# Patient Record
Sex: Female | Born: 1990 | Race: White | Hispanic: No | Marital: Single | Smoking: Former smoker
Health system: Southern US, Community
[De-identification: ages and names within clinical notes are randomized; demographics above are authoritative.]

## PROBLEM LIST (undated history)

## (undated) DIAGNOSIS — F1911 Other psychoactive substance abuse, in remission: Secondary | ICD-10-CM

## (undated) DIAGNOSIS — F329 Major depressive disorder, single episode, unspecified: Secondary | ICD-10-CM

## (undated) DIAGNOSIS — E010 Iodine-deficiency related diffuse (endemic) goiter: Secondary | ICD-10-CM

## (undated) DIAGNOSIS — Z8742 Personal history of other diseases of the female genital tract: Secondary | ICD-10-CM

## (undated) DIAGNOSIS — F32A Depression, unspecified: Secondary | ICD-10-CM

## (undated) DIAGNOSIS — T3390XA Superficial frostbite of unspecified sites, initial encounter: Secondary | ICD-10-CM

## (undated) DIAGNOSIS — L709 Acne, unspecified: Secondary | ICD-10-CM

## (undated) DIAGNOSIS — F909 Attention-deficit hyperactivity disorder, unspecified type: Secondary | ICD-10-CM

## (undated) HISTORY — DX: Depression, unspecified: F32.A

## (undated) HISTORY — DX: Major depressive disorder, single episode, unspecified: F32.9

## (undated) HISTORY — DX: Iodine-deficiency related diffuse (endemic) goiter: E01.0

## (undated) HISTORY — DX: Acne, unspecified: L70.9

## (undated) HISTORY — DX: Other psychoactive substance abuse, in remission: F19.11

## (undated) HISTORY — DX: Superficial frostbite of unspecified sites, initial encounter: T33.90XA

## (undated) HISTORY — DX: Attention-deficit hyperactivity disorder, unspecified type: F90.9

## (undated) HISTORY — DX: Personal history of other diseases of the female genital tract: Z87.42

---
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
## 2009-04-06 ENCOUNTER — Ambulatory Visit: Payer: Self-pay | Admitting: Internal Medicine

## 2009-08-08 ENCOUNTER — Ambulatory Visit: Payer: Self-pay | Admitting: Internal Medicine

## 2009-08-08 DIAGNOSIS — R5381 Other malaise: Secondary | ICD-10-CM

## 2009-08-08 DIAGNOSIS — R5383 Other fatigue: Secondary | ICD-10-CM

## 2009-08-08 DIAGNOSIS — F17200 Nicotine dependence, unspecified, uncomplicated: Secondary | ICD-10-CM

## 2009-08-08 LAB — CONVERTED CEMR LAB
Beta hcg, urine, semiquantitative: NEGATIVE
Bilirubin Urine: NEGATIVE
Nitrite: NEGATIVE
Protein, U semiquant: NEGATIVE
Urobilinogen, UA: 1

## 2009-08-17 LAB — CONVERTED CEMR LAB
ALT: 14 units/L (ref 0–35)
AST: 24 units/L (ref 0–37)
Albumin: 4.9 g/dL (ref 3.5–5.2)
Alkaline Phosphatase: 48 units/L (ref 39–117)
Basophils Absolute: 0.1 10*3/uL (ref 0.0–0.1)
CO2: 28 meq/L (ref 19–32)
Chloride: 107 meq/L (ref 96–112)
Eosinophils Absolute: 0.2 10*3/uL (ref 0.0–0.7)
Free T4: 0.9 ng/dL (ref 0.6–1.6)
GFR calc non Af Amer: 85.08 mL/min (ref >60)
Glucose, Bld: 79 mg/dL (ref 70–99)
HCT: 41.1 % (ref 36.0–46.0)
Hemoglobin: 13.6 g/dL (ref 12.0–15.0)
Hgb A1c MFr Bld: 5.1 % (ref 4.6–6.5)
Lymphocytes Relative: 31.8 % (ref 12.0–46.0)
Lymphs Abs: 2 10*3/uL (ref 0.7–4.0)
MCHC: 33.2 g/dL (ref 30.0–36.0)
Monocytes Absolute: 0.2 10*3/uL (ref 0.1–1.0)
Monocytes Relative: 3.8 % (ref 3.0–12.0)
Neutro Abs: 3.8 10*3/uL (ref 1.4–7.7)
Neutrophils Relative %: 59.9 % (ref 43.0–77.0)
Potassium: 4.4 meq/L (ref 3.5–5.1)
T3, Free: 2.8 pg/mL (ref 2.3–4.2)
TSH: 1.31 microintl units/mL (ref 0.35–5.50)
Total Protein: 8.1 g/dL (ref 6.0–8.3)
WBC: 6.3 10*3/uL (ref 4.5–10.5)

## 2009-08-22 ENCOUNTER — Ambulatory Visit: Payer: Self-pay | Admitting: Internal Medicine

## 2009-08-23 ENCOUNTER — Ambulatory Visit: Payer: Self-pay | Admitting: Internal Medicine

## 2009-08-27 ENCOUNTER — Encounter: Payer: Self-pay | Admitting: Internal Medicine

## 2009-08-27 LAB — CONVERTED CEMR LAB
Glucose, 2 hour: 111 mg/dL
Glucose, Fasting: 98 mg/dL (ref 70–99)

## 2009-09-28 ENCOUNTER — Ambulatory Visit: Payer: Self-pay | Admitting: Cardiology

## 2009-09-28 DIAGNOSIS — R55 Syncope and collapse: Secondary | ICD-10-CM

## 2009-12-07 ENCOUNTER — Ambulatory Visit: Payer: Self-pay | Admitting: Internal Medicine

## 2009-12-07 DIAGNOSIS — F4323 Adjustment disorder with mixed anxiety and depressed mood: Secondary | ICD-10-CM

## 2010-02-22 ENCOUNTER — Emergency Department (HOSPITAL_COMMUNITY): Admission: EM | Admit: 2010-02-22 | Discharge: 2010-02-23 | Payer: Self-pay | Admitting: Emergency Medicine

## 2010-03-26 ENCOUNTER — Telehealth: Payer: Self-pay | Admitting: *Deleted

## 2010-04-30 ENCOUNTER — Telehealth: Payer: Self-pay | Admitting: Internal Medicine

## 2010-06-20 ENCOUNTER — Emergency Department (HOSPITAL_COMMUNITY): Admission: EM | Admit: 2010-06-20 | Discharge: 2010-02-24 | Payer: Self-pay | Admitting: Emergency Medicine

## 2010-08-13 NOTE — Progress Notes (Signed)
Summary: Pt req work in ov with Dr. Fabian Sharp today for ear inf.  Phone Note Call from Patient Call back at [phone] cell   Caller: Patient Summary of Call: Pt called and is req work in appt with Dr. Fabian Sharp today. Pt has possible ear inf. Pls advise.      Initial call taken by: Lucy Antigua,  April 30, 2010 11:38 AM  Follow-up for Phone Call        Pt called back to advise that she will go to [facility] for eval of possible ear infection..... Pt was offered appt with another physician but declined stating she is already on her way to UC.  Follow-up by: Debbra Riding,  April 30, 2010 12:09 PM

## 2010-08-13 NOTE — Progress Notes (Signed)
Summary: Pt req script for Vyvanse and Focalin  Phone Note Call from Patient Call back at [phone] cell   Caller: Patient Summary of Call: Pt called and said that Dr. Fabian Sharp told her that she would start prescribing her meds that pt normally gets from psychiatrist. Pt req script for Vyvanse ? mg  and Focalin 15mg .  Pls call when ready for pick.    Initial call taken by: Lucy Antigua,  March 26, 2010 1:49 PM  Follow-up for Phone Call        No notes from Psych. According to last ov notes she was going to set up with another psych about her medication management. Has she seen anyone else? If so we need a note of treatment plan. Dr. Rosezella Florida opinion she should treated by a psych. until such time the specialist feels it is app. for primary care to manage her medication. We will need a note stating this as well. Follow-up by: Romualdo Bolk, CMA Duncan Dull),  March 26, 2010 2:19 PM  Additional Follow-up for Phone Call Additional follow up Details #1::        Spoke to pt and she was given a vyvanse 20mg  of her brother's by her mother. Pt hasn't seen any other md for this. She liked it better but hasn't been on any medication except the prozac since school has been out. Pt is aware that she does need to see a specialist for medication management until the specialist feels that it would be okay for a primary care to manage her care. Pt would like to know who Dr. Fabian Sharp would recommend. Additional Follow-up by: Romualdo Bolk, CMA Duncan Dull),  March 26, 2010 3:50 PM    Additional Follow-up for Phone Call Additional follow up Details #2::    Per Dr. Fabian Sharp- try Dr. Maren Beach or Dr. Loralie Champagne office. Romualdo Bolk, CMA (AAMA)  March 26, 2010 5:13 PM Left message on machine about this. Follow-up by: Romualdo Bolk, CMA (AAMA),  March 27, 2010 9:14 AM

## 2010-08-13 NOTE — Assessment & Plan Note (Signed)
Summary: summer camp cpx//ccm   Vital Signs:  Patient profile:   20 year old female Menstrual status:  Depo Provera LMP:     11/29/2009 Height:      65.6 inches Weight:      145 pounds Temp:     98.2 degrees F oral Pulse rate:   82 / minute BP sitting:   110 / 90  (right arm) Cuff size:   regular  Vitals Entered By: Kathrynn Speed CMA (Dec 07, 2009 9:20 AM) CC: Jacqueline Griffith  Vision Screening:Left eye w/o correction: 20 / 30 Right Eye w/o correction: 20 / 30 Both eyes w/o correction:  20/ 25        Vision Entered By: Kathrynn Speed CMA (Dec 07, 2009 9:42 AM) LMP (date): 11/29/2009 LMP - Character: normal Menarche (age onset years): 13   Menses interval (days): irrg. Menstrual flow (days): 3 Enter LMP: 11/29/2009   Preventive Screening-Counseling & Management  Alcohol-Tobacco     Alcohol drinks/day: 0     Smoking Status: quit     Year Quit: 06/13/2009  Caffeine-Diet-Exercise     Caffeine use/day: 1-2     Does Patient Exercise: yes  History of Present Illness: Jacqueline Griffith  comes in today   for a check up for [place] program that includes  wilderness activity and rock climbing in [place].  She needs check up  and form filled out . Since last visit changed   ocps to Nuvaring  and  feels less moody.     Ocass dizziness and not as frequent   and does better when eats   something.  Saw Dr Shirlee Latch and was supposed to get echo and holter 24 hour but never hear from office about this while she was in school .  Will not be taking focalin at camp. usually sees Dr Madaline Guthrie. but asks to have [place] rx prozac .     Wants to change psych. still sees counseler.    Past History:  Past Medical History: 1. acne 2.ADHD 3. depression 4. ? thyromegly    5. frostbite  toes  Hx of substance abuse   Consults Dr. Samule Dry prozac rx  Past History:  Care Management: Gynecology: Vincente Poli Psychiatry  : Clista Bernhardt  and counselor .   Psychology.Bradley Ferris   Social History: Does  Patient Exercise:  yes  Review of Systems  The patient denies anorexia, fever, weight loss, weight gain, vision loss, decreased hearing, hoarseness, syncope, prolonged cough, abdominal pain, melena, hematochezia, severe indigestion/heartburn, unusual weight change, abnormal bleeding, enlarged lymph nodes, angioedema, and breast masses.         some upper back pain she attributes to large breasts  and considering breast reduction. no le difficulties   Physical Exam  General:      Well appearing adolescent,no acute distress Head:      normocephalic and atraumatic  Eyes:      PERRL, EOMs full, conjunctiva clear  Ears:      TM's pearly gray with normal light reflex and landmarks, canals clear  Nose:      Clear without Rhinorrhea Mouth:      Clear without erythema, edema or exudate, mucous membranes moist teeth adequat repair Neck:      supple without adenopathy  Chest wall:      no deformities or breast masses noted.  Tanner V Breast.   Lungs:      Clear to ausc, no crackles, rhonchi or wheezing, no grunting, flaring or retractions  Heart:  RRR without murmur quiet precordium.   Abdomen:      BS+, soft, non-tender, no masses, no hepatosplenomegaly  Genitalia:      normal female  Musculoskeletal:      no scoliosis, normal gait, normal posture Pulses:      femoral pulses present  without dealy Extremities:      Well perfused with no cyanosis or deformity noted  Neurologic:      Neurologic exam grossly intact  non focal no tremor Developmental:      alert and cooperative  Skin:      intact without lesions, rashes  Cervical nodes:      No lymphadenopathy noted Axillary nodes:      no significant adenopathy.   Inguinal nodes:      no significant adenopathy.   Psychiatric:      alert and cooperative   Impression & Recommendations:  Problem # 1:  PREVENTIVE HEALTH CARE (ICD-V70.0)  Discussed nutrition,exercise,diet,healthy weight, vitamin D and calcium.    states  had all 3 HPV and is UTD      Orders: Vision Screening (16109)  Problem # 2:  ADJUSTMENT DISORDER WITH MIXED FEATURES (ICD-309.28) depression and hx of subs abuse  doing well now. ok t refill meds until  gets another psych .  Problem # 3:  DIZZINESS SPELLS (ICD-780.4) Assessment: Improved still rec  getting echo and holter before doing the wilderness  camp at end of July   Problem # 4:  hx of frostbite   Problem # 5:  upper back pain ms no alarm features  could be postural  vs  relative gynecomastia.  Complete Medication List: 1)  Prozac 20 Mg Caps (Fluoxetine hcl) .... Take 1 capsule by mouth once a day 2)  Focalin Xr 20 Mg Xr24h-cap (Dexmethylphenidate hcl) .... Take one tablet as needed 3)  Nuvaring 0.12-0.015 Mg/24hr Ring (Etonogestrel-ethinyl estradiol)  Immunization History:  Tetanus/Td Immunization History:    Tetanus/Td:  historical (11/20/1994)  Hepatitis A Immunization History:    Hepatitis A # 1:  historical (09/22/2006)  Patient Instructions: 1)  Suggest get an adult psych and continue counseling . 2)  Finish the cardiology evlauation with echo and 24 hour holter. 3)  we will contact there office and you should also call to set this up. 4)  I suspect it will be normal but would do this before going to the [employer] camp. 5)  Agree with consultation  about  breast reduction  if  causing upper back pain.  Prescriptions: PROZAC 20 MG CAPS (FLUOXETINE HCL) Take 1 capsule by mouth once a day  #30 x 2   Entered and Authorized by:   Madelin Headings MD   Signed by:   Madelin Headings MD on 12/07/2009   Method used:   Electronically to        [facility])       [address]       [place], [place]  [address]       Ph: [phone]       Fax: [phone]   RxID:   [id number]  ][Preventive Care Screening2-CCC] appt  made for echo    in June  . pt will be contacted about holter   form signed .   [facility].  Prevention & Chronic  Care Immunizations   Influenza vaccine: Fluvax 3+  (04/06/2009)    Tetanus booster: 11/20/1994: Historical    Pneumococcal vaccine:  Not documented  Other Screening   Pap smear: Not documented   Smoking status: quit  (12/07/2009)

## 2010-08-13 NOTE — Assessment & Plan Note (Signed)
Summary: ? virus//ccm   Vital Signs:  Patient profile:   20 year old female Menstrual status:  Depo Provera Weight:      143 pounds Pulse rate:   72 / minute BP sitting:   100 / 70  (right arm) Cuff size:   regular  Vitals Entered By: Romualdo Bolk, CMA (AAMA) (August 22, 2009 2:59 PM) CC: Weakness has gotten worse and legs are now cramping up. Legs started cramping up 1 week ago.   History of Present Illness: Jacqueline Griffith comes in today for   above  follow up of fatigue   cold sweats and spells.   Stopped the nicotine replacement.   but  cold sweats still continue .    Dizzy spell described as   feeling  weak and faint  and   going on 3 x per day  even when driving. ( had to pull over side of road)   feels hot      episodic dizziness not related to given activity and then leg cramps at night  . hard  to sleep with this.  No change in focalin 3 x per week.  Has been on this for a while  Says this is different than a panic attack . no known palpitations.    of cp sob with this.   spells are weak and near syncopal and shaky and sometimes gets a  soda with this.   Wonders about "Hypoglycemia" no hx of diabetes  or fasting   Preventive Screening-Counseling & Management  Alcohol-Tobacco     Alcohol drinks/day: 0     Smoking Status: quit     Year Quit: 06/13/2009  Caffeine-Diet-Exercise     Caffeine use/day: 1-2     Does Patient Exercise: no  Current Medications (verified): 1)  Prozac 20 Mg Caps (Fluoxetine Hcl) .... Take 1 Capsule By Mouth Once A Day 2)  Depo-Provera 150 Mg/ml Susp (Medroxyprogesterone Acetate) 3)  Focalin Xr 10 Mg Xr24h-Cap (Dexmethylphenidate Hcl)  ? Dose .... Take Daily As Needed  Per Psych  Allergies (verified): No Known Drug Allergies  Past History:  Past medical, surgical, family and social histories (including risk factors) reviewed, and no changes noted (except as noted below).  Past Medical History: Reviewed history from 08/08/2009 and  no changes required. acne anxiety depression ? thyromegly    Hx of rehab from RD use last year doing well.       Consults Dr. Samule Dry prozac rx  Family History: Reviewed history from 08/21/2008 and no changes required. neg for thyroid disease    Social History: Reviewed history from 08/08/2009 and no changes required. caffiene soda ocassional   ? school ok.   Non smoker   quit of 4-5   in december  nicotinepatch   use. school at [employer]   lives apt with BF  Sleep :   8-9 hours   could sleep more.    Review of Systems  The patient denies anorexia, fever, weight loss, weight gain, vision loss, decreased hearing, hoarseness, chest pain, dyspnea on exertion, peripheral edema, prolonged cough, abdominal pain, transient blindness, difficulty walking, depression, unusual weight change, abnormal bleeding, enlarged lymph nodes, and angioedema.         lmp  on depo   neg ucg last time.  Physical Exam  General:  Well-developed,well-nourished,in no acute distress; alert,appropriate and cooperative throughout examination Head:  normocephalic and atraumatic.   Eyes:  vision grossly intact and  pupils equal.   Ears:  R ear normal and L ear normal.   Nose:  no external deformity and no external erythema.   Mouth:  pharynx pink and moist.   Neck:  No deformities, masses, or tenderness noted. Lungs:  Normal respiratory effort, chest expands symmetrically. Lungs are clear to auscultation, no crackles or wheezes. Heart:  Normal rate and regular rhythm. S1 and S2 normal without gallop, murmur, click, rub or other extra sounds. Abdomen:  Bowel sounds positive,abdomen soft and non-tender without masses, organomegaly or   noted. Pulses:  nl cap refill  Neurologic:  grossly nl  Skin:  turgor normal, color normal, no ecchymoses, and no petechiae.   Cervical Nodes:  No lymphadenopathy noted Psych:  Oriented X3, good eye contact, not anxious appearing, and not  depressed appearing.     Impression & Recommendations:  Problem # 1:  DIZZINESS SPELLS (ICD-780.4) episodic   persisting and progressive  . would have though post viral would improve.  unclear cause   recently more problematic    ocurred when driving  dont really sound cardiac     reactive hypoglycemia possible but not  is not a disease just a condition.   EKG   reading as abnormal conduction but intervals ok some  sinus arrythmia    pr  still ok  q in V2 .    sheis on 2 meds that can effect  qt   intervals  prozac and focalin xr but no changes   recently.    Disc hypoglycemia as a situation and not a reaction and not a disease.  but will go ahead and do a GTT.   Orders: EKG w/ Interpretation (93000) TLB-Glucose Tol (3 HR) (16109-6EAVW) Cardiology Referral (Cardiology)  Problem # 2:  ? of ABNORMAL ELECTROCARDIOGRAM (ICD-794.31)  see tracing   get cards to check. Orders: Cardiology Referral (Cardiology)  Complete Medication List: 1)  Prozac 20 Mg Caps (Fluoxetine hcl) .... Take 1 capsule by mouth once a day 2)  Depo-provera 150 Mg/ml Susp (Medroxyprogesterone acetate) 3)  Focalin Xr 10 Mg Xr24h-cap (dexmethylphenidate Hcl) ? Dose  .... Take daily as needed  per psych  Patient Instructions: 1)  will do cardiology consult  to get opinion  on your EKG and symptom  2)  We should schedule a  glucose tolerance test  at the  [facility] lab.

## 2010-08-13 NOTE — Assessment & Plan Note (Signed)
Summary: fatigue/cold sweats/njr   Vital Signs:  Patient profile:   20 year old female Menstrual status:  Depo Provera Height:      65 inches Weight:      139 pounds BMI:     23.21 Temp:     98.5 degrees F oral Pulse rate:   66 / minute BP sitting:   110 / 60  (right arm) Cuff size:   regular  Vitals Entered By: Romualdo Bolk, CMA (AAMA) (August 08, 2009 3:26 PM) CC: For the past few weeks, pt has been having cold sweats and fatigue. Pt states that she gets faint and weak. Pt states that her body temp gets really hot. She has been having more stomach pains and ha's. When she was younger she was dx with lactose intolerance and leaky bladder. She has also been having diarrhea and then constipation. Pt would like to get labs done. LMP - Character: normal Menarche (age onset years): 13   Menses interval (days): irrg. Menstrual flow (days): 3 Menstrual Status Depo Provera   History of Present Illness: Jacqueline Griffith comes  in today for   SDA visit  for above symptom .     HAs and SA  seem  fairly chronic and tolerable   but now has episodes of    cold sweats.   Hard to exercise  because of fatigue .     Feels shaky.   feels faint. at times but no syncope.  worried about diabetes or other dx.  Weight up more than down.   insecure  about weight but no meds to lose weight.  Not skipping meals.   On  depo for 10 months.   No sig caffiene and no alcohol or rd.   hx of rehab a year ago to be drug free  doing well. now back in school .   No known risk of pregnancy.   Mood : sees psych and counseot and is on  prozac and doing well. Takes focalin as needed for school.   Is trying to stop tobacco again  about 4-5 per day using 21 nicotine patch.  off and on .     Preventive Screening-Counseling & Management  Alcohol-Tobacco     Alcohol drinks/day: 0     Smoking Status: quit     Year Quit: 06/13/2009  Caffeine-Diet-Exercise     Caffeine use/day: 1-2     Does Patient  Exercise: no  Current Medications (verified): 1)  Prozac 20 Mg Caps (Fluoxetine Hcl) .... Take 1 Capsule By Mouth Once A Day 2)  Depo-Provera 150 Mg/ml Susp (Medroxyprogesterone Acetate)  Allergies (verified): No Known Drug Allergies  Past History:  Past medical, surgical, family and social histories (including risk factors) reviewed, and no changes noted (except as noted below).  Past Medical History: acne anxiety depression ? thyromegly    Hx of rehab from RD use last year doing well.       Consults Dr. Samule Dry prozac rx  Past History:  Care Management: Gynecology: Vincente Poli Psychologist: Clista Bernhardt  and counselor   Family History: Reviewed history from 08/21/2008 and no changes required. neg for thyroid disease    Social History: Reviewed history from 08/21/2008 and no changes required. caffiene soda ocassional   ? school ok.   Non smoker   quit of 4-5   in december  nicotinepatch   use. school at [employer]   lives apt with BF  Sleep :  8-9 hours   could sleep more.  Smoking Status:  quit Caffeine use/day:  1-2 Does Patient Exercise:  no  Review of Systems  The patient denies anorexia, fever, weight loss, weight gain, vision loss, decreased hearing, hoarseness, chest pain, syncope, dyspnea on exertion, peripheral edema, prolonged cough, headaches, hemoptysis, abdominal pain, melena, hematochezia, severe indigestion/heartburn, hematuria, incontinence, transient blindness, difficulty walking, unusual weight change, abnormal bleeding, enlarged lymph nodes, and angioedema.         this fatigue is not felt from dpression  Physical Exam  General:  Well-developed,well-nourished,in no acute distress; alert,appropriate and cooperative throughout examination Head:  normocephalic and atraumatic.   Eyes:  vision grossly intact.   Ears:  R ear normal and L ear normal.   Nose:  no external deformity, no external erythema, and no nasal  discharge.   Mouth:  pharynx pink and moist.   Neck:  No deformities, masses, or tenderness noted. ?thyroid palpable. Lungs:  Normal respiratory effort, chest expands symmetrically. Lungs are clear to auscultation, no crackles or wheezes.no dullness.   Heart:  Normal rate and regular rhythm. S1 and S2 normal without gallop, murmur, click, rub or other extra sounds. Abdomen:  Bowel sounds positive,abdomen soft and non-tender without masses, organomegaly or   noted. Pulses:  nl cap refill  Neurologic:  non focal  Skin:  turgor normal, color normal, no ecchymoses, no petechiae, and no purpura.   Cervical Nodes:  No lymphadenopathy noted Psych:  Oriented X3, normally interactive, good eye contact, not anxious appearing, and not depressed appearing.     Impression & Recommendations:  Problem # 1:  MALAISE AND FATIGUE (ICD-780.79) Assessment New non focal exam r/o metabolic  cause. Orders: TLB-BMP (Basic Metabolic Panel-BMET) (80048-METABOL) TLB-CBC Platelet - w/Differential (85025-CBCD) TLB-Hepatic/Liver Function Pnl (80076-HEPATIC) TLB-TSH (Thyroid Stimulating Hormone) (84443-TSH) TLB-T3, Free (Triiodothyronine) (84481-T3FREE) TLB-T4 (Thyrox), Free (84439-FT4R) TLB-A1C / Hgb A1C (Glycohemoglobin) (83036-A1C) TLB-Sedimentation Rate (ESR) (85652-ESR) T- * Misc. Laboratory test [phone]) UA Dipstick w/o Micro (automated)  (81003) Urine Pregnancy Test  (60454)  Problem # 2:  cold sweats  consider nicotine therapy possible adding  r/o metabolic  doesnt sound like fever  reassuring that exam is unrevealing.   Complete Medication List: 1)  Prozac 20 Mg Caps (Fluoxetine hcl) .... Take 1 capsule by mouth once a day 2)  Depo-provera 150 Mg/ml Susp (Medroxyprogesterone acetate) 3)  Focalin Xr 10 Mg Xr24h-cap (dexmethylphenidate Hcl) ? Dose  .... Take daily as needed  per psych  Patient Instructions: 1)  Decrease   nicotine patch  amount.   and wean if possible  2)  To see if  cold sweats are  related.    3)  You will be informed of lab results when available.  then plan follow up  Laboratory Results   Urine Tests    Routine Urinalysis   Color: yellow Appearance: Clear Glucose: negative   (Normal Range: Negative) Bilirubin: negative   (Normal Range: Negative) Ketone: negative   (Normal Range: Negative) Spec. Gravity: 1.020   (Normal Range: 1.003-1.035) Blood: negative   (Normal Range: Negative) pH: 7.0   (Normal Range: 5.0-8.0) Protein: negative   (Normal Range: Negative) Urobilinogen: 1.0   (Normal Range: 0-1) Nitrite: negative   (Normal Range: Negative) Leukocyte Esterace: negative   (Normal Range: Negative)    Urine HCG: negative Comments: Rita Ohara  August 08, 2009 4:42 PM

## 2010-08-13 NOTE — Assessment & Plan Note (Signed)
Summary: np6/resch from NOS/jss   Referring Provider:  Berniece Andreas, MD Primary Provider:  Madelin Headings MD  CC:  new patient.  hx abnormal EKG.  History of Present Illness: 20 yo with history of ADHD on dexmethylphenidate presents for evaluation of "dizzy spells."  Since 12/10, she has had 2-3 episodes per week.  She will feel lightheaded, lasting for anywhere from 5-30 minutes.  She will feel hot, flushed, and diaphoretic with the episodes.  She has never passed out.  No palpitations or racing heart rate.  Episodes do not appear to be related to position, sometimes she will be sitting and sometimes standing.  She has never fallen.  She has checked her blood glucose during a spell, and it has not been low.  No trigger for these spells can be identified. 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  She has been staying up until late at night.  She has been taking more dexmethylphenidate this semester.   ECG: NSR, sinus arrhythmia, normal QTc. Normal overall ECG.   Current Medications (verified): 1)  Prozac 20 Mg Caps (Fluoxetine Hcl) .... Take 1 Capsule By Mouth Once A Day 2)  Depo-Provera 150 Mg/ml Susp (Medroxyprogesterone Acetate) 3)  Focalin Xr 20 Mg Xr24h-Cap (Dexmethylphenidate Hcl) .... Take One Tablet As Needed  Allergies (verified): No Known Drug Allergies  Past History:  Past Medical History: 1. acne 2.ADHD 3. depression 4. ? thyromegly     Consults Dr. Samule Dry prozac rx  Family History: No heart disease that she knows of.   Social History: Daily caffeine.  Non smoker: quit 12/10 [employer] student Occasional ETOH  Review of Systems       All systems reviewed and negative except as per HPI.   Vital Signs:  Patient profile:   20 year old female Menstrual status:  Depo Provera Height:      65 inches Weight:      145 pounds BMI:     24.22 Pulse rate:   81 / minute Pulse rhythm:   regular BP sitting:   114 / 68  (left arm) Cuff size:    regular  Vitals Entered By: Judithe Modest CMA (September 28, 2009 11:16 AM)  Physical Exam  General:  Well developed, well nourished, in no acute distress. Head:  normocephalic and atraumatic Nose:  no deformity, discharge, inflammation, or lesions Mouth:  Teeth, gums and palate normal. Oral mucosa normal. Neck:  Neck supple, no JVD. No masses, thyromegaly or abnormal cervical nodes. Lungs:  Clear bilaterally to auscultation and percussion. Heart:  Non-displaced PMI, chest non-tender; regular rate and rhythm, S1, S2 without murmurs, rubs or gallops. Carotid upstroke normal, no bruit.  Pedals normal pulses. No edema, no varicosities. Abdomen:  Bowel sounds positive; abdomen soft and non-tender without masses, organomegaly, or hernias noted. No hepatosplenomegaly. Msk:  Back normal, normal gait. Muscle strength and tone normal. Extremities:  No clubbing or cyanosis. Neurologic:  Alert and oriented x 3. Skin:  Intact without lesions or rashes. Psych:  Normal affect.   Impression & Recommendations:  Problem # 1:  DIZZINESS SPELLS (ICD-780.4) These do not seem to have a trigger.  She can be sitting or standing.  Accompanied by feeling of flushing and diaphoresis.  Never had syncope.  Started this semester: increased stress, less sleep, using more dexmethylphenidate.  ECG is normal.  Episodes probably are vasovagal.  I will have her do a 24 hour holter monitor to look for arrhythmia.  I will  get an echocardiogram to make sure her heart is structurally normal.  I talked to her about physical counterpressure maneuvers that can be used to prevent passing out when symptoms occur.  Finally, would try staying off the dexmethylphenidate for a week to see if this helps symptoms.   Other Orders: EKG w/ Interpretation (93000) Holter Monitor (Holter Monitor) Echocardiogram (Echo)  Patient Instructions: 1)  Your physician has requested that you have an echocardiogram.  Echocardiography is a painless test  that uses sound waves to create images of your heart. It provides your doctor with information about the size and shape of your heart and how well your heart's chambers and valves are working.  This procedure takes approximately one hour. There are no restrictions for this procedure. 2)  Your physician has recommended that you wear a holter monitor.  Holter monitors are medical devices that record the heart's electrical activity. Doctors most often use these monitors to diagnose arrhythmias. Arrhythmias are problems with the speed or rhythm of the heartbeat. The monitor is a small, portable device. You can wear one while you do your normal daily activities. This is usually used to diagnose what is causing palpitations/syncope (passing out). 24 hour monitor 3)  Your physician recommends that you schedule a follow-up appointment as needed with Dr Shirlee Latch.

## 2010-08-16 ENCOUNTER — Ambulatory Visit: Payer: Self-pay | Admitting: Internal Medicine

## 2010-09-27 LAB — DIFFERENTIAL
Basophils Absolute: 0 10*3/uL (ref 0.0–0.1)
Basophils Relative: 0 % (ref 0–1)
Basophils Relative: 4 % — ABNORMAL HIGH (ref 0–1)
Eosinophils Absolute: 0.1 10*3/uL (ref 0.0–0.7)
Eosinophils Relative: 3 % (ref 0–5)
Lymphocytes Relative: 4 % — ABNORMAL LOW (ref 12–46)
Lymphs Abs: 0.5 10*3/uL — ABNORMAL LOW (ref 0.7–4.0)
Monocytes Absolute: 0.3 10*3/uL (ref 0.1–1.0)
Monocytes Absolute: 0.5 10*3/uL (ref 0.1–1.0)
Neutro Abs: 4.7 10*3/uL (ref 1.7–7.7)
Neutrophils Relative %: 84 % — ABNORMAL HIGH (ref 43–77)

## 2010-09-27 LAB — COMPREHENSIVE METABOLIC PANEL
AST: 30 U/L (ref 0–37)
Albumin: 3.9 g/dL (ref 3.5–5.2)
Albumin: 4.3 g/dL (ref 3.5–5.2)
Alkaline Phosphatase: 46 U/L (ref 39–117)
BUN: 3 mg/dL — ABNORMAL LOW (ref 6–23)
CO2: 24 mEq/L (ref 19–32)
Calcium: 8.7 mg/dL (ref 8.4–10.5)
Creatinine, Ser: 1.03 mg/dL (ref 0.4–1.2)
GFR calc Af Amer: >60 mL/min (ref >60)
GFR calc non Af Amer: >60 mL/min (ref >60)
GFR calc non Af Amer: >60 mL/min (ref >60)
Glucose, Bld: 105 mg/dL — ABNORMAL HIGH (ref 70–99)
Sodium: 138 mEq/L (ref 135–145)
Total Bilirubin: 0.4 mg/dL (ref 0.3–1.2)
Total Bilirubin: 0.8 mg/dL (ref 0.3–1.2)
Total Protein: 7.5 g/dL (ref 6.0–8.3)

## 2010-09-27 LAB — CBC
HCT: 41.3 % (ref 36.0–46.0)
Hemoglobin: 14.4 g/dL (ref 12.0–15.0)
MCHC: 34.9 g/dL (ref 30.0–36.0)
MCV: 82.6 fL (ref 78.0–100.0)
Platelets: 202 10*3/uL (ref 150–400)
Platelets: 224 10*3/uL (ref 150–400)
RBC: 5 MIL/uL (ref 3.87–5.11)
RDW: 11.2 % — ABNORMAL LOW (ref 11.5–15.5)
RDW: 12.3 % (ref 11.5–15.5)
WBC: 5.6 10*3/uL (ref 4.0–10.5)
WBC: 9.4 10*3/uL (ref 4.0–10.5)

## 2010-09-27 LAB — WET PREP, GENITAL
Clue Cells Wet Prep HPF POC: NONE SEEN
Yeast Wet Prep HPF POC: NONE SEEN

## 2010-09-27 LAB — URINE MICROSCOPIC-ADD ON

## 2010-09-27 LAB — GC/CHLAMYDIA PROBE AMP, GENITAL: Chlamydia, DNA Probe: NEGATIVE

## 2010-09-27 LAB — URINE CULTURE
Colony Count: NO GROWTH
Culture  Setup Time: 201108141502
Culture: NO GROWTH

## 2010-09-27 LAB — POCT I-STAT, CHEM 8
BUN: 3 mg/dL — ABNORMAL LOW (ref 6–23)
Calcium, Ion: 1.09 mmol/L — ABNORMAL LOW (ref 1.12–1.32)
Chloride: 102 mEq/L (ref 96–112)
Creatinine, Ser: 1 mg/dL (ref 0.4–1.2)
Glucose, Bld: 114 mg/dL — ABNORMAL HIGH (ref 70–99)
HCT: 45 % (ref 36.0–46.0)
Hemoglobin: 15.3 g/dL — ABNORMAL HIGH (ref 12.0–15.0)
TCO2: 23 mmol/L (ref 0–100)

## 2010-09-27 LAB — MONONUCLEOSIS SCREEN: Mono Screen: NEGATIVE

## 2010-09-27 LAB — URINALYSIS, ROUTINE W REFLEX MICROSCOPIC
Bilirubin Urine: NEGATIVE
Glucose, UA: NEGATIVE mg/dL
Hgb urine dipstick: NEGATIVE
Leukocytes, UA: NEGATIVE
Protein, ur: 30 mg/dL — AB
Urobilinogen, UA: 1 mg/dL (ref 0.0–1.0)
pH: 6 (ref 5.0–8.0)

## 2010-09-27 LAB — LACTIC ACID, PLASMA: Lactic Acid, Venous: 1.7 mmol/L (ref 0.5–2.2)

## 2010-11-29 NOTE — Assessment & Plan Note (Signed)
[facility]                            [facility] OFFICE NOTE   NAME:Jacqueline Griffith                     MRN:          [id number]  DATE:09/07/2006                            DOB:          03-Mar-1991    CHIEF COMPLAINT:  New patient, will need immunizations, has fever and  respiratory symptoms.   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  Her previous care was through [facility].  She  also sees Dr. Phillip Heal, psychiatrist, for anxiety/depression/mood  swings, and Dr. Terri Piedra for acne for which she has just begun on  Accutane.  She was in her usual state of health until about less than 24  hours ago when she had the onset of fever, achiness, chills, throat  congestion, and a minor cough.  Many people in her school are out and  the community is in epidemic proportion of confirmed influenza.  She did  take some Advil and has felt much better since that time this morning.   She has recently had a history of a stomach ache while visiting a friend  in [place] about August 29, 2006.  She ended up in the emergency room  where an ultrasound was done and was told it was negative and that she  had extra acid in her stomach, was given some type of medicine as needed  that helped significantly.  She has not had a recurrence since that  time.   She has had a 3-week history of some lower back discomfort in the LS  area without history of specific injury although she does have a heavy  bookbag.  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  She does some abdominal  crunches but no extra weight.  Her back symptoms occur when she bends  over and raises back up, but otherwise there is no radiation or  neurologic symptoms or systemic symptoms related to this GI or GU.   She does need  immunizations eventually, a Tdap, Menactra, and hepatitis  A #2.   PAST MEDICAL HISTORY:  She does bring in with mom her old records from  [facility] that will be reviewed.  Of note, she has had  problems with stomach aches and headaches in the past, but then was  diagnosed with anxiety and treated, and apparently these things are much  improved.  She was the 7-pound 14-ounce product of a normal gestation  born in [place].  No needle difficulties and no major injuries or  hospitalizations.   FAMILY HISTORY:  Positive for depression, some tendency toward alcohol  use or overuse in her family.  Father has high cholesterol.  Paternal  uncle with a heart transplant.  There are also seasonal allergies in the  family.   SOCIAL HISTORY:  Household of five.  Has been in [employer]  since 9th grade.  Has two dogs at home.  Negative ETS or firearms.  Negative TAD.   MEDICATIONS:  1. Fluoxetine 20 mg a day.  2. Lamictal just started.  3. Accutane just started.   DRUG ALLERGIES:  None recorded.   OBJECTIVE:  VITAL SIGNS:  Height 5 feet 6 inches, weight 127 pounds,  temperature 97.4, pulse 78 and regular, blood pressure 120/70.  GENERAL:  This is a WD/WN mildly-ill-appearing teen in no acute  distress.  HEENT:  Normocephalic, TMs clear, eyes PRL.  OP clear.  She does have  braces on.  Nares +2 congested.  Face nontender.  SKIN:  Some inflammatory acne on face, no acute rashes.  CHEST:  CTA, BS equal.  CARDIAC:  S1, S2.  No gallops or murmurs.  ABDOMEN:  Soft.  No hepatomegaly, guarding, or rebound.  EXTREMITIES:  No acute changes.  BACK:  She points to the lumbosacral area as the area of pain.  Gait is  within normal limits.   LABORATORY:  Rapid flu test was negative.   IMPRESSION:  1. Influenza-like respiratory illness, early, with negative rapid      influenza test.  I have discussed symptomatic treatment.  However,      if she does get significant fever and  chills tonight would do      empiric treatment of her flu with Tamiflu 75 one p.o. b.i.d.      Otherwise, symptom treatment as needed.  2. Back pain.  It appears to be mechanical without associated symptoms      and could possibly improve/self-resolve.  However, because she is      beginning track season and doing the high jump which will include      extension lordosis of her back, I have recommended that she be seen      by sports medicine to be evaluated to avoid any exacerbation of her      current symptoms.  3. Anxiety/depression/mood problems.  Apparently stable on medication.      Will follow up.  4. Acne, newly on Accutane as per Dr. Terri Piedra.  5. History of recent abdominal pain, has since resolved.  Will monitor      this also.   They will schedule a wellness visit and at that point do appropriate  immunizations and sports clearance as appropriate.  Review records in  the meantime.  Mom will check into getting comprehensive, lipids and a  blood count done in regard to a wellness visit when she gets her  monitoring for her Accutane.     Neta Mends. Fabian Sharp, MD  Electronically Signed    WKP/MedQ  DD: 09/07/2006  DT: 09/08/2006  Job #: [id number]

## 2011-01-13 ENCOUNTER — Encounter: Payer: Self-pay | Admitting: Internal Medicine

## 2011-01-14 ENCOUNTER — Encounter: Payer: Self-pay | Admitting: Internal Medicine

## 2011-01-14 VITALS — BP 110/80 | HR 84 | Temp 98.5°F | Wt 145.0 lb

## 2011-01-14 DIAGNOSIS — R197 Diarrhea, unspecified: Secondary | ICD-10-CM

## 2011-01-14 DIAGNOSIS — J069 Acute upper respiratory infection, unspecified: Secondary | ICD-10-CM

## 2011-01-14 DIAGNOSIS — A09 Infectious gastroenteritis and colitis, unspecified: Secondary | ICD-10-CM

## 2011-01-14 MED ORDER — AZITHROMYCIN 500 MG PO TABS: ORAL_TABLET | ORAL | Status: DC

## 2011-01-14 NOTE — Patient Instructions (Signed)
Take azithromycin for poss traveler diarrhea   Take 2 pills one time.  If  persistent or progressive next week then we should arrange to do specific stool tests.

## 2011-01-14 NOTE — Progress Notes (Signed)
  Subjective:    Patient ID: Jacqueline Griffith, female    DOB: 01-Oct-1990, 20 y.o.   MRN: [id number]  HPI Pt comesi n for acute appt toda because of gi disturbance.   Onset 10 days ago   In [place]  And then [place] No change over the time.Onset with diarrhea loose watery and yelllow no blood  ?  Had Food poisoning in [place] and others also  had vomiting.  Lasted 24 hours.  Stools 3-4  Per day and cramps not too bad.   Eating reg food.   Tried peptobismol  .    Review of Systems No fever chills or current vomiting   Skin rashes  Sob or wheezing  Some sx of vaginal  yeast  Due for menses.  Past history family history social history reviewed in the electronic medical record.     Objective:   Physical Exam   WDWN on nad nose congestion. HEENT: Normocephalic ;atraumatic , Eyes;  PERRL, EOMs  Full, lids and conjunctiva clear,,Ears: no deformities, canals nl, TM landmarks normal, Nose: no deformity ..congested stuffy Neck: no masses Chest:  Clear to A&P without wheezes rales or rhonchi CV:  S1-S2 no gallops or murmurs peripheral perfusion is normal Abdomen:  Sof,t normal bowel sounds without hepatosplenomegaly, no guarding rebound or masses no CVA tenderness No clubbing cyanosis or edema skin non icteric .     Assessment & Plan:  Diarrhea  Not severe but ersistent   prob travelers diarrhea Will rx empirically with azithro   Also had  ur infection. Consider stool  Cultures other tests if   persistent or progressive      URi somewhat prolinged  Lungs clear

## 2011-06-10 ENCOUNTER — Telehealth: Payer: Self-pay | Admitting: Internal Medicine

## 2011-06-10 NOTE — Telephone Encounter (Signed)
Left message on machine returning patient's call for more details

## 2011-06-10 NOTE — Telephone Encounter (Signed)
Pt is throwing up and has blood in vomit. Pt would like to come in today

## 2011-06-18 NOTE — Telephone Encounter (Signed)
Pt's mom never called back. 

## 2011-08-25 IMAGING — CT CT ABD-PELV W/ CM
2 of 4 series · 17 of 46 positions shown, 19 images · IV contrast (APPLIED)
Comparison: None.

CLINICAL DATA: Fever with nausea and vomiting today.  Evaluate for
appendicitis.

CT ABDOMEN AND PELVIS WITH CONTRAST
TECHNIQUE: Multidetector CT imaging of the abdomen and pelvis was
performed following the standard protocol during bolus
administration of intravenous contrast.
Contrast: 125 ml Pmnipaque-TBB intravenously.

[Series 2: abd_pel 5.0 b40f st · axial · 0.70mm/px · z∈[-498,-72]mm · 14 of 93 slices shown, 16 images]
[im 4/93  soft-tissue]
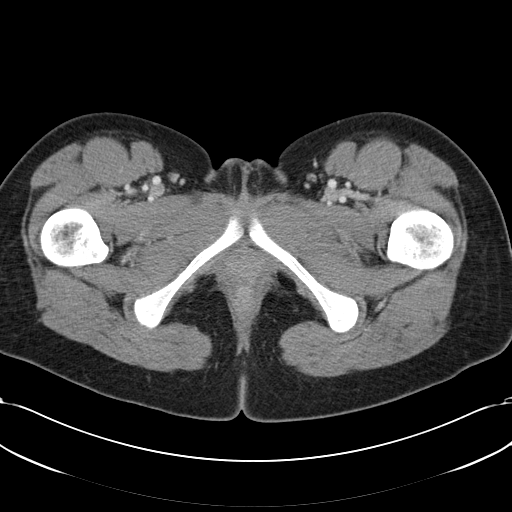
[im 4/93  bone]
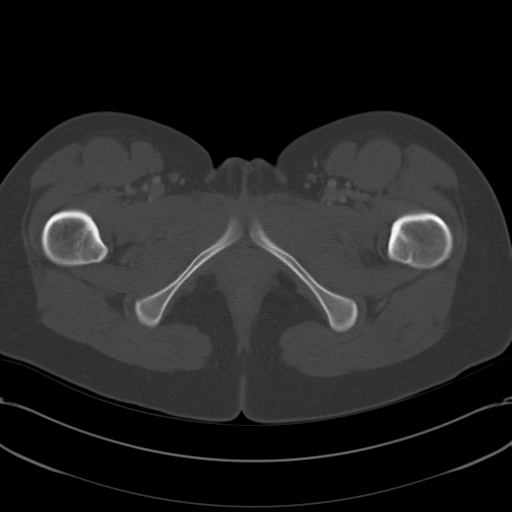
[im 12/93  soft-tissue]
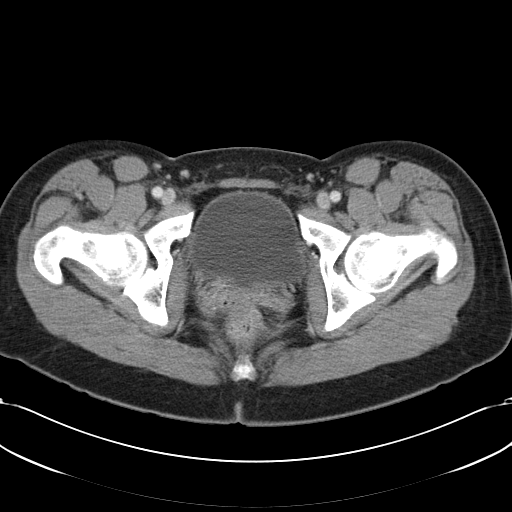
[im 19/93  soft-tissue]
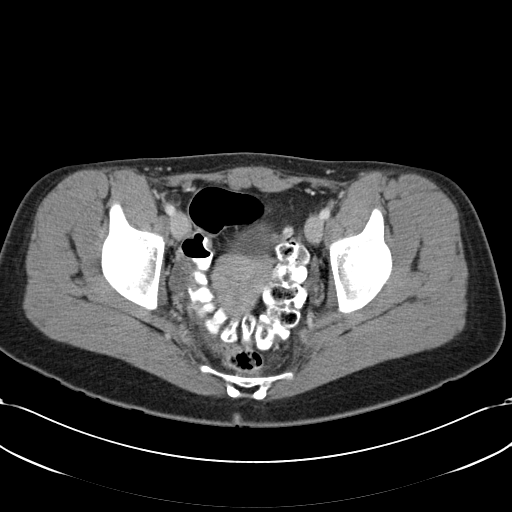
[im 26/93  soft-tissue]
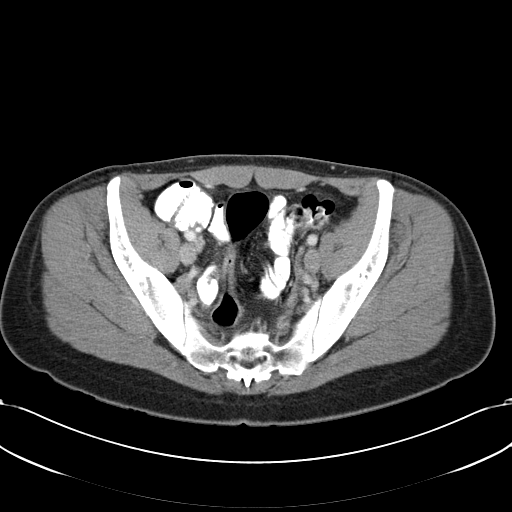
[im 30/93  soft-tissue]
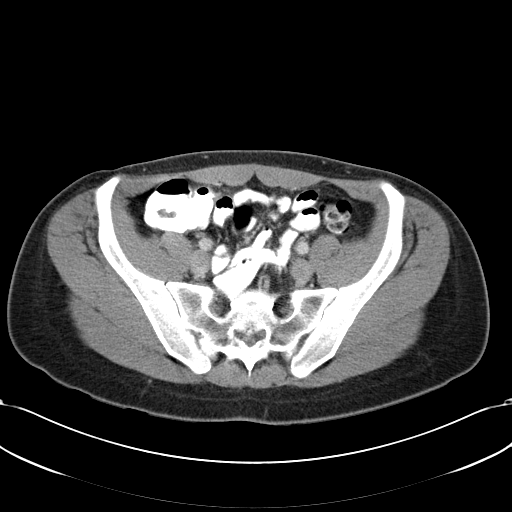
[im 37/93  soft-tissue]
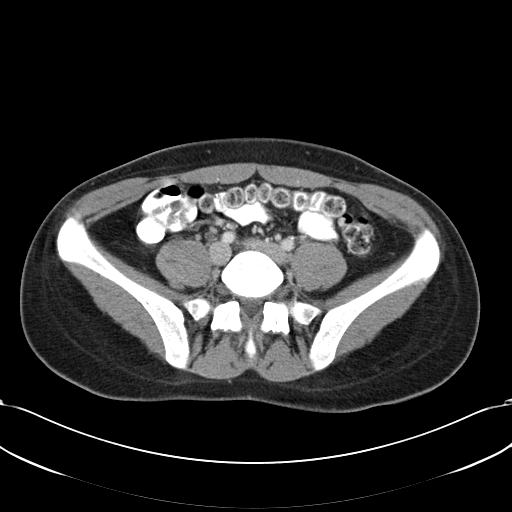
[im 45/93  soft-tissue]
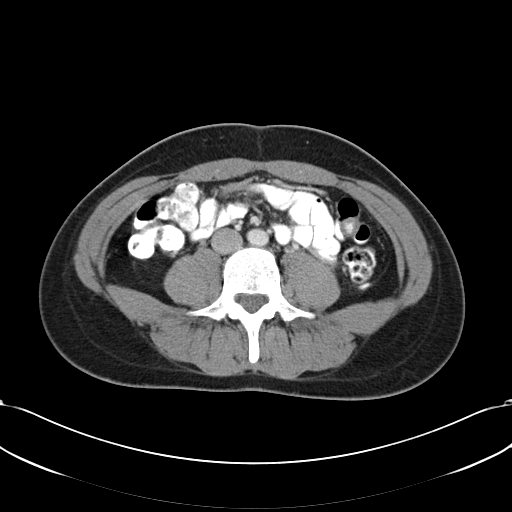
[im 48/93  soft-tissue]
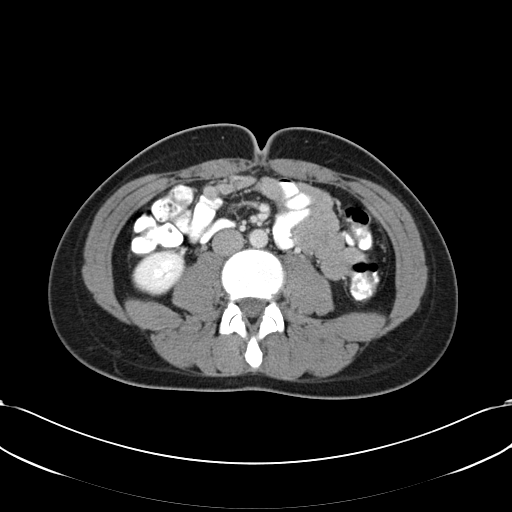
[im 56/93  soft-tissue]
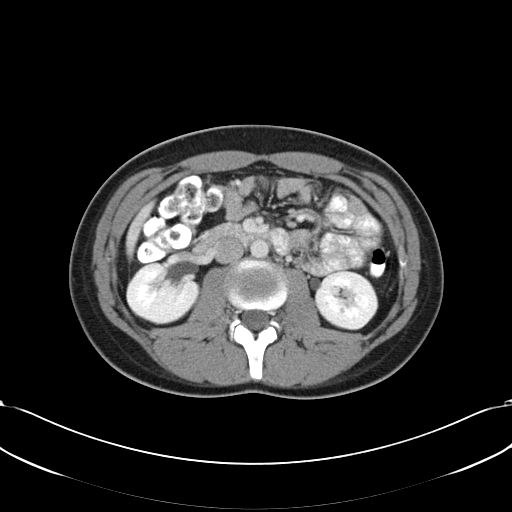
[im 56/93  bone]
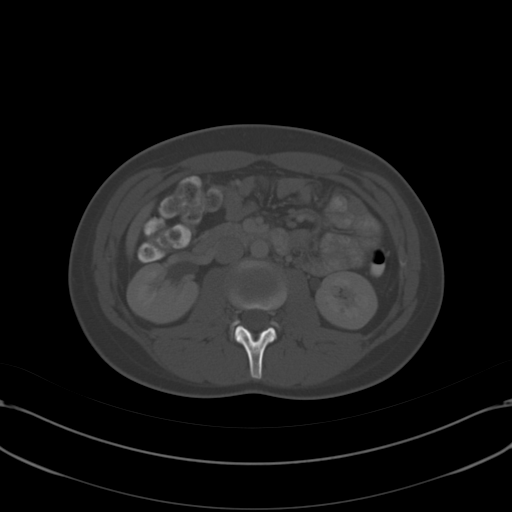
[im 63/93  soft-tissue]
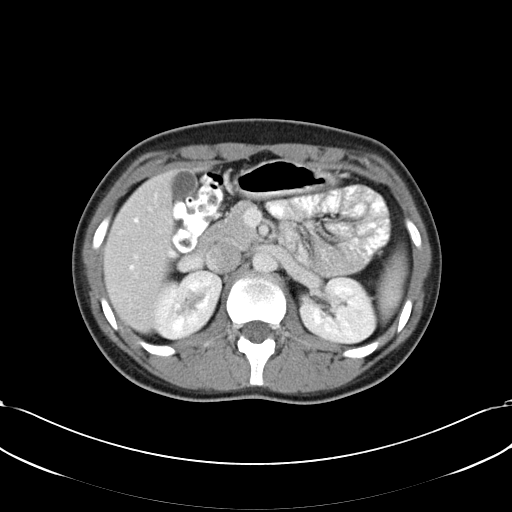
[im 70/93  soft-tissue]
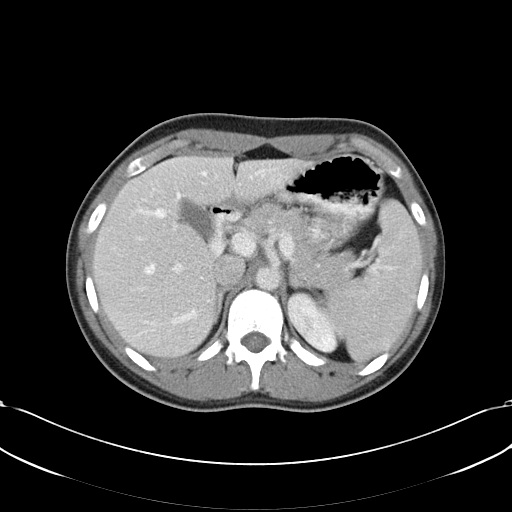
[im 74/93  soft-tissue]
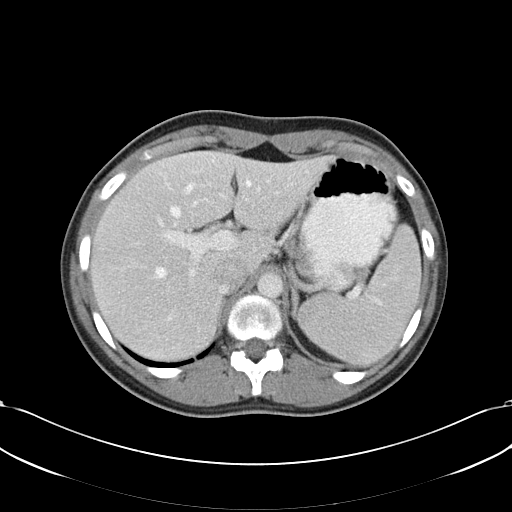
[im 81/93  soft-tissue]
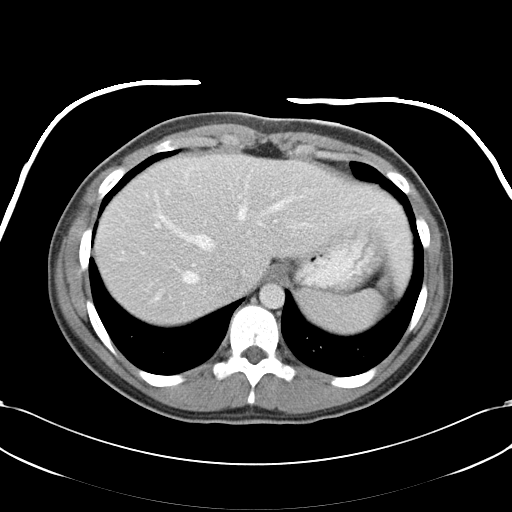
[im 89/93  soft-tissue]
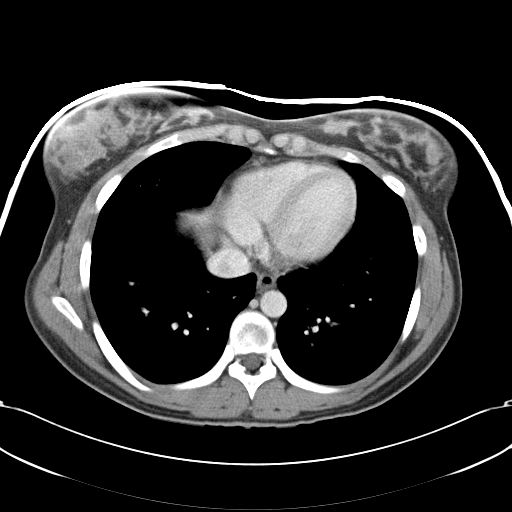

[Series 602: coronal abdomen · coronal · 0.94mm/px · 3 of 101 slices shown]
[im 34/101  soft-tissue]
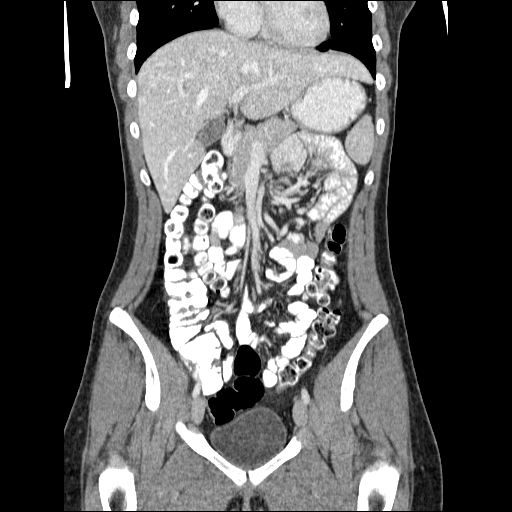
[im 45/101  soft-tissue]
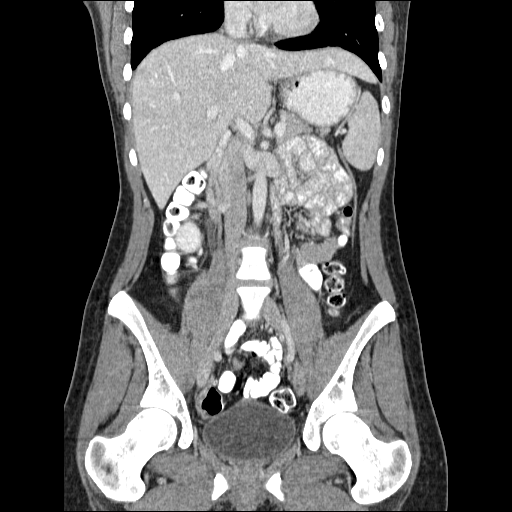
[im 56/101  soft-tissue]
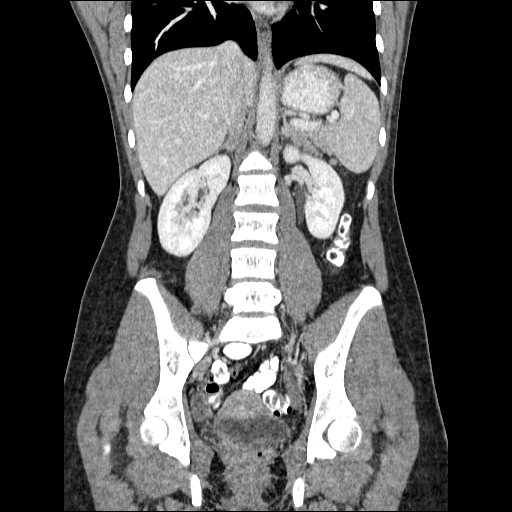

[17 of 46 positions shown; findings below may reference images not displayed]

FINDINGS: The lung bases are clear.  There is no pleural effusion.

The liver, spleen, gallbladder, pancreas, adrenal glands and
kidneys appear normal.

The bowel gas pattern is normal.  Retrocecal appendix extends into
the right adnexa and is normal in caliber and partially filled with
contrast.  This is seen on the axial images 68 - 72.  There is no
evidence of periappendiceal inflammatory change.  There is no
adnexal mass.  The uterus and ovaries appear normal.  A small
amount of free pelvic fluid is within physiologic limits.
IMPRESSION: No evidence of appendicitis or other acute abdominal pelvic
process.  Normal bowel gas pattern.

## 2011-08-26 ENCOUNTER — Other Ambulatory Visit: Payer: Self-pay | Admitting: Internal Medicine

## 2011-08-26 ENCOUNTER — Encounter: Payer: Self-pay | Admitting: Internal Medicine

## 2011-08-26 ENCOUNTER — Ambulatory Visit (INDEPENDENT_AMBULATORY_CARE_PROVIDER_SITE_OTHER): Payer: BC Managed Care – PPO | Admitting: Internal Medicine

## 2011-08-26 VITALS — BP 120/80 | HR 112 | Temp 99.7°F | Wt 152.0 lb

## 2011-08-26 DIAGNOSIS — J029 Acute pharyngitis, unspecified: Secondary | ICD-10-CM | POA: Insufficient documentation

## 2011-08-26 DIAGNOSIS — R112 Nausea with vomiting, unspecified: Secondary | ICD-10-CM | POA: Insufficient documentation

## 2011-08-26 DIAGNOSIS — R509 Fever, unspecified: Secondary | ICD-10-CM

## 2011-08-26 LAB — POCT RAPID STREP A (OFFICE): Rapid Strep A Screen: NEGATIVE

## 2011-08-26 MED ORDER — PROMETHAZINE HCL 25 MG PO TABS: 25.0000 mg | ORAL_TABLET | Freq: Four times a day (QID) | ORAL | Status: DC | PRN

## 2011-08-26 NOTE — Progress Notes (Signed)
  Subjective:    Patient ID: Jacqueline Griffith, female    DOB: 12/27/1990, 21 y.o.   MRN: [id number]  HPI Patient comes in today for SDA  For acute problem evaluation. Acute onset of illness  Less than 24 hours  Driving back  To town from weekend at  [facility] .  Achy and then vomiting .   Hard to sleep and sweats and bad sore throat temp 100.1  Took ibuprofen  No cough  But bad sore throat.  Water and gaterade  Vomited. No new rash or other exposures.  Remote hx of strep.    periods normal no GU sx.  Review of Systems Back breaking   Out NO cp sob diarrhea or as per hpi  No sig HA rest as per hpi     Past history family history social history reviewed in the electronic medical record.     Objective:   Physical Exam WDWN in nad  Sick laying down    Non toxic  HEENT: Normocephalic ;atraumatic , Eyes;  PERRL, EOMs  Full, lids and conjunctiva clear,,Ears: no deformities, canals nl, TM landmarks normal, Nose: no deformity or discharge  Mouth : OP 2+ red no exudate and no  Edema  Nl airway. Neck: Supple no or masses or bruits  Ac nodes tender and shoddy [prominent nontender pc nodes   Chest:  Clear to A&P without wheezes rales or rhonchi CV:  S1-S2 no gallops or murmurs peripheral perfusion is normal Abdomen:  Sof,t normal bowel sounds without hepatosplenomegaly, no guarding rebound or masses no CVA tenderness Skin: normal capillary refill ,turgor , color: No acute rashes ,petechiae or bruising Some acne back and few on face.  Neuro : non focal alert oriented.  .RS neg  cx pending      Assessment & Plan:  Acute febrile illness with pharyngitis and NV    Do strep cx  Consider viral etiologies   Supportive care.  Discussed.   Can try antiemetic as needed call with alarm symptoms such as persistent fever dehydration. If persistent consider evaluation for mono.

## 2011-08-26 NOTE — Patient Instructions (Addendum)
Will call you  About the throat culture . This could be viral infection such as adenovirus or even mono. Marland Kitchen   Rest and fluids   Can use  Med for nausea but can make you drowsy.    Small amount of fluids  Frequently to avoid dehydration. Water gatorade ok avoid caffiene  Alcohol and juices. .   Expect fever to be gone in another 48 hours call if not . And we  May need to recheck.    Tylenol or  Ibuprofen for fever and chills. Tylenol.may be better on the stomach

## 2011-08-27 ENCOUNTER — Telehealth: Payer: Self-pay | Admitting: *Deleted

## 2011-08-27 ENCOUNTER — Emergency Department (HOSPITAL_COMMUNITY)
Admission: EM | Admit: 2011-08-27 | Discharge: 2011-08-27 | Disposition: A | Discharge status: Home / Self Care | Payer: BC Managed Care – PPO | Attending: Emergency Medicine | Admitting: Emergency Medicine

## 2011-08-27 ENCOUNTER — Emergency Department (HOSPITAL_COMMUNITY): Payer: BC Managed Care – PPO

## 2011-08-27 ENCOUNTER — Encounter (HOSPITAL_COMMUNITY): Payer: Self-pay | Admitting: *Deleted

## 2011-08-27 DIAGNOSIS — F411 Generalized anxiety disorder: Secondary | ICD-10-CM | POA: Insufficient documentation

## 2011-08-27 DIAGNOSIS — R112 Nausea with vomiting, unspecified: Secondary | ICD-10-CM | POA: Insufficient documentation

## 2011-08-27 DIAGNOSIS — R509 Fever, unspecified: Secondary | ICD-10-CM | POA: Insufficient documentation

## 2011-08-27 DIAGNOSIS — R599 Enlarged lymph nodes, unspecified: Secondary | ICD-10-CM | POA: Insufficient documentation

## 2011-08-27 DIAGNOSIS — R000 Tachycardia, unspecified: Secondary | ICD-10-CM | POA: Insufficient documentation

## 2011-08-27 DIAGNOSIS — E876 Hypokalemia: Secondary | ICD-10-CM | POA: Insufficient documentation

## 2011-08-27 DIAGNOSIS — J039 Acute tonsillitis, unspecified: Secondary | ICD-10-CM | POA: Insufficient documentation

## 2011-08-27 LAB — POCT I-STAT, CHEM 8
BUN: 4 mg/dL — ABNORMAL LOW (ref 6–23)
Chloride: 100 mEq/L (ref 96–112)
Sodium: 137 mEq/L (ref 135–145)

## 2011-08-27 LAB — CBC
Platelets: 185 10*3/uL (ref 150–400)
RBC: 4.83 MIL/uL (ref 3.87–5.11)
WBC: 21.8 10*3/uL — ABNORMAL HIGH (ref 4.0–10.5)

## 2011-08-27 MED ORDER — HYDROCODONE-ACETAMINOPHEN 7.5-500 MG/15ML PO SOLN: 15.0000 mL | Freq: Four times a day (QID) | ORAL | Status: AC | PRN

## 2011-08-27 MED ORDER — POTASSIUM CHLORIDE 20 MEQ/15ML (10%) PO LIQD
40.0000 meq | Freq: Once | ORAL | Status: AC
  Administered 2011-08-27: 40 meq via ORAL
  Filled 2011-08-27: qty 30

## 2011-08-27 MED ORDER — DEXAMETHASONE SODIUM PHOSPHATE 10 MG/ML IJ SOLN
10.0000 mg | Freq: Once | INTRAMUSCULAR | Status: AC
  Administered 2011-08-27: 10 mg via INTRAVENOUS
  Filled 2011-08-27: qty 1

## 2011-08-27 MED ORDER — ONDANSETRON HCL 4 MG/2ML IJ SOLN
4.0000 mg | Freq: Once | INTRAMUSCULAR | Status: AC
  Administered 2011-08-27: 4 mg via INTRAVENOUS
  Filled 2011-08-27: qty 2

## 2011-08-27 MED ORDER — LIDOCAINE VISCOUS 2 % MT SOLN
10.0000 mL | Freq: Once | OROMUCOSAL | Status: AC
  Administered 2011-08-27: 10 mL via OROMUCOSAL
  Filled 2011-08-27 (×2): qty 10

## 2011-08-27 MED ORDER — HYDROMORPHONE HCL PF 1 MG/ML IJ SOLN
1.0000 mg | Freq: Once | INTRAMUSCULAR | Status: AC
  Administered 2011-08-27: 1 mg via INTRAVENOUS
  Filled 2011-08-27: qty 1

## 2011-08-27 MED ORDER — ACETAMINOPHEN 650 MG RE SUPP
650.0000 mg | Freq: Once | RECTAL | Status: AC
  Administered 2011-08-27: 650 mg via RECTAL
  Filled 2011-08-27: qty 1

## 2011-08-27 MED ORDER — PENICILLIN G BENZATHINE 1200000 UNIT/2ML IM SUSP
1.2000 10*6.[IU] | Freq: Once | INTRAMUSCULAR | Status: AC
  Administered 2011-08-27: 1.2 10*6.[IU] via INTRAMUSCULAR
  Filled 2011-08-27: qty 2

## 2011-08-27 MED ORDER — MORPHINE SULFATE 4 MG/ML IJ SOLN
4.0000 mg | Freq: Once | INTRAMUSCULAR | Status: AC
  Administered 2011-08-27: 4 mg via INTRAVENOUS
  Filled 2011-08-27: qty 1

## 2011-08-27 MED ORDER — SODIUM CHLORIDE 0.9 % IV BOLUS (SEPSIS)
1000.0000 mL | Freq: Once | INTRAVENOUS | Status: AC
  Administered 2011-08-27: 1000 mL via INTRAVENOUS

## 2011-08-27 MED ORDER — IOHEXOL 300 MG/ML  SOLN
100.0000 mL | Freq: Once | INTRAMUSCULAR | Status: AC | PRN
  Administered 2011-08-27: 100 mL via INTRAVENOUS

## 2011-08-27 MED ORDER — ONDANSETRON 4 MG PO TBDP: 4.0000 mg | ORAL_TABLET | Freq: Four times a day (QID) | ORAL | Status: AC | PRN

## 2011-08-27 NOTE — ED Notes (Signed)
Urine collected in lab

## 2011-08-27 NOTE — ED Notes (Signed)
Pt states she started to have a sore throat yesterday. Pt also states she had nausea/vomiting yesterday. Pt denies any chest pain or abdominal pain. Pt is alert and oriented at this time.

## 2011-08-27 NOTE — Telephone Encounter (Addendum)
Pt is having extreme amount of bleeding from throat this am, and does not know where to go.  Advised to go straight to the ER.  Pain is much more severe than yesterday.  Call sent from [facility] to [facility].

## 2011-08-27 NOTE — Discharge Instructions (Signed)
Use the pain medication syrup as needed. Use zofran for your nausea/vomiting. Cepacol cough drops and salt water gargles can be helpful for a sore throat. Stick with a clear liquid diet as we discussed for 24 hours, then you can progress to a bland diet as tolerated.   Tonsillitis Tonsils are lumps of lymphoid tissues at the back of the throat. Each tonsil has 20 crevices (crypts). Tonsils help fight nose and throat infections and keep infection from spreading to other parts of the body for the first 18 months of life. Tonsillitis is an infection of the throat that causes the tonsils to become red, tender, and swollen. CAUSES Sudden and, if treated, temporary (acute) tonsillitis is usually caused by infection with streptococcal bacteria. Long lasting (chronic) tonsillitis occurs when the crypts of the tonsils become filled with pieces of food and bacteria, which makes it easy for the tonsils to become constantly infected. SYMPTOMS  Symptoms of tonsillitis include:  A sore throat.   White patches on the tonsils.   Fever.   Tiredness.  DIAGNOSIS Tonsillitis can be diagnosed through a physical exam. Diagnosis can be confirmed with the results of lab tests, including a throat culture. TREATMENT  The goals of tonsillitis treatment include the reduction of the severity and duration of symptoms, prevention of associated conditions, and prevention of disease transmission. Tonsillitis caused by bacteria can be treated with antibiotics. Usually, treatment with antibiotics is started before the cause of the tonsillitis is known. However, if it is determined that the cause is not bacterial, antibiotics will not treat the tonsillitis. If attacks of tonsillitis are severe and frequent, your caregiver may recommend surgery to remove the tonsils (tonsillectomy). HOME CARE INSTRUCTIONS   Rest as much as possible and get plenty of sleep.   Drink plenty of fluids. While the throat is very sore, eat soft foods  or liquids, such as sherbet, soups, or instant breakfast drinks.   Eat frozen ice pops.   Older children and adults may gargle with a warm or cold liquid to help soothe the throat. Mix 1 teaspoon of salt in 1 cup of water.   Other family members who also develop a sore throat or fever should have a medical exam or throat culture.   Only take over-the-counter or prescription medicines for pain, discomfort, or fever as directed by your caregiver.  SEEK MEDICAL CARE IF:   Your baby is older than 3 months with a rectal temperature of 100.5 F (38.1 C) or higher for more than 1 day.   Large, tender lumps develop in your neck.   A rash develops.   Green, yellow-brown, or bloody substance is coughed up.   You are unable to swallow liquids or food for 24 hours.   Your child is unable to swallow food or liquids for 12 hours.  SEEK IMMEDIATE MEDICAL CARE IF:   You develop any new symptoms such as vomiting, severe headache, stiff neck, chest pain, or trouble breathing or swallowing.   You have severe throat pain along with drooling or voice changes.   You have severe pain, unrelieved with recommended medications.   You are unable to fully open the mouth.   You develop redness, swelling, or severe pain anywhere in the neck.   You have a fever.   Your baby is older than 3 months with a rectal temperature of 102 F (38.9 C) or higher.   Your baby is 78 months old or younger with a rectal temperature of 100.4 F (  38 C) or higher.  MAKE SURE YOU:   Understand these instructions.   Will watch your condition.   Will get help right away if you are not doing well or get worse.  Document Released: 04/09/2005 Document Revised: 03/12/2011 Document Reviewed: 09/05/2010 [facility] Patient Information 2012 [place], [place].        Clear Liquid Diet The clear liquid dietconsists of foods that are liquid or will become liquid at room temperature.You should be able to see through the  liquid and beverages. Examples of foods allowed on a clear liquid diet include fruit juice, broth or bouillon, gelatin, or frozen ice pops. The purpose of this diet is to provide necessary fluid, electrolytes such a sodium and potassium, and energy to keep the body functioning during times when you are not able to consume a regular diet.A clear liquid diet should not be continued for long periods of time as it is not nutritionally adequate.  REASONS FOR USING A CLEAR LIQUID DIET  In sudden onset (acute) conditions for a patient before or after surgery.   As the first step in oral feeding.   For fluid and electrolyte replacement in diarrheal diseases.   As a diet before certain medical tests are performed.  ADEQUACY The clear liquid diet is adequate only in ascorbic acid, according to the Recommended Dietary Allowances of the [employer]. CHOOSING FOODS Breads and Starches  Allowed:  None are allowed.   Avoid: All are avoided.  Vegetables  Allowed:  Strained tomato or vegetable juice.   Avoid: Any others.  Fruit  Allowed:  Strained fruit juices and fruit drinks. Include 1 serving of citrus or vitamin C-enriched fruit juice daily.   Avoid: Any others.  Meat and Meat Substitutes  Allowed:  None are allowed.   Avoid: All are avoided.  Milk  Allowed:  None are allowed.   Avoid: All are avoided.  Soups and Combination Foods  Allowed:  Clear bouillon, broth, or strained broth-based soups.   Avoid: Any others.  Desserts and Sweets  Allowed:  Sugar, honey. High protein gelatin. Flavored gelatin, ices, or frozen ice pops that do not contain milk.   Avoid: Any others.  Fats and Oils  Allowed:  None are allowed.   Avoid: All are avoided.  Beverages  Allowed:  Carbonated beverages, cereal beverages, coffee (regular or decaffeinated), or tea.   Avoid: Any others.  Condiments  Allowed:  Iodized salt.   Avoid: Any others, including pepper.   Supplements  Allowed:  Liquid nutrition beverages.   Avoid: Any others that contain lactose or fiber.  SAMPLE MEAL PLAN Breakfast  4 oz strained orange juice.    to 1 cup gelatin (plain or fortified).   1 cup beverage (coffee or tea).   Sugar, if desired.  Midmorning Snack   cup gelatin (plain or fortified).  Lunch  1 cup broth or consomm.   4 oz strained grapefruit juice.    cup gelatin (plain or fortified).   1 cup beverage (coffee or tea).   Sugar, if desired.  Midafternoon Snack   cup fruit ice.    cup strained fruit juice.  Dinner  1 cup broth or consomm.    cup cranberry juice.    cup flavored gelatin (plain or fortified).   1 cup beverage (coffee or tea).   Sugar, if desired.  Evening Snack  4 oz strained apple juice (vitamin C-fortified).    cup flavored gelatin (plain or fortified).  Document Released: 06/30/2005 Document Revised:  03/12/2011 Document Reviewed: 09/27/2010 [facility] Patient Information 2012 [place], [place].            Salt Water Gargle This solution will help make your mouth and throat feel better. HOME CARE INSTRUCTIONS   Mix 1 teaspoon of salt in 8 ounces of warm water.   Gargle with this solution as much or often as you need or as directed. Swish and gargle gently if you have any sores or wounds in your mouth.   Do not swallow this mixture.  Document Released: 04/03/2004 Document Revised: 03/12/2011 Document Reviewed: 08/25/2008 [facility] Patient Information 2012 [place], [place].

## 2011-08-27 NOTE — Telephone Encounter (Deleted)
Notes from ER given to Fleet Contras to give to Dr. Tawanna Cooler.  Seems pt had ovarian cyst.

## 2011-08-27 NOTE — ED Provider Notes (Signed)
Medical screening examination/treatment/procedure(s) were conducted as a shared visit with non-physician practitioner(s) and myself.  I personally evaluated the patient during the encounter  Cyndra Numbers, MD 08/27/11 1818

## 2011-08-27 NOTE — ED Notes (Signed)
Patient transported to CT 

## 2011-08-27 NOTE — Telephone Encounter (Signed)
Ed note reviewed  Had elevated WBC  Potassium of 3.0 and ct of neck showed no abscess just  Severe tonsillitis   Given iv fluids , im pcn and  Decadron.   Rad sugg mr of ant mediastinum after resolved.

## 2011-08-27 NOTE — ED Notes (Signed)
Fluids given.

## 2011-08-27 NOTE — ED Notes (Signed)
Receiving nurse notified pt's is in room

## 2011-08-27 NOTE — ED Provider Notes (Signed)
History     CSN: [id number]  Arrival date & time 08/27/11  1031   First MD Initiated Contact with Patient 08/27/11 1033      Chief Complaint  Patient presents with  . Sore Throat  . Nausea  . Emesis    (Consider location/radiation/quality/duration/timing/severity/associated sxs/prior treatment) Patient is a 21 y.o. female presenting with pharyngitis. The history is provided by the patient.  Sore Throat Associated symptoms include chills, a fever, nausea, neck pain and vomiting. Pertinent negatives include no abdominal pain, chest pain, coughing, headaches, joint swelling, numbness or rash.   the patient is an otherwise healthy 21 year old female who presents to the emergency department with one day of sore throat with associated fever, chills, nausea, vomiting, malaise. Throat pain is sharp, severe, constant, non-radiating. She does have associated difficulty swallowing as well as a change in voice. She was seen by her primary doctor yesterday and reports a negative strep screen. They advised her that it was likely a virus of some sort and she was given Phenergan to take for nausea, which has not been successful in preventing vomiting. Her maximum temperature at home was 100.69F. Denies associated HA, change in vision or hearing, ear pain, rhinorrhea, cough, CP, SOB, abdominal pain, myalgias.   Past Medical History  Diagnosis Date  . Acne   . ADHD (attention deficit hyperactivity disorder)   . Depression   . Thyromegaly   . Frostbite     toes  . H/O: substance abuse     History reviewed. No pertinent past surgical history.  No family history on file.  History  Substance Use Topics  . Smoking status: Former Smoker    Quit date: 06/13/2009  . Smokeless tobacco: Not on file  . Alcohol Use: Yes     Occ.     Review of Systems  Constitutional: Positive for fever, chills and appetite change.  HENT: Positive for neck pain. Negative for neck stiffness.        See HPI  Eyes:  Negative for pain and visual disturbance.  Respiratory: Negative for cough, chest tightness and shortness of breath.   Cardiovascular: Negative for chest pain.  Gastrointestinal: Positive for nausea and vomiting. Negative for abdominal pain.  Genitourinary: Negative for dysuria.  Musculoskeletal: Negative for back pain, joint swelling and gait problem.  Skin: Negative for rash.  Neurological: Negative for syncope, speech difficulty, numbness and headaches.  Hematological: Positive for adenopathy.  Psychiatric/Behavioral: Negative for confusion.    Allergies  Review of patient's allergies indicates no known allergies.  Home Medications   Current Outpatient Rx  Name Route Sig Dispense Refill  . DEXMETHYLPHENIDATE HCL ER 20 MG PO CP24 Oral Take 20 mg by mouth daily.      Marland Kitchen FLUOXETINE HCL 40 MG PO CAPS Oral Take 40 mg by mouth daily.    Marland Kitchen PROMETHAZINE HCL 25 MG PO TABS Oral Take 1 tablet (25 mg total) by mouth every 6 (six) hours as needed for nausea. 16 tablet 0    BP 138/117  Pulse 109  Temp(Src) 98 F (36.7 C) (Oral)  Resp 24  Ht 5\' 5"  (1.651 m)  Wt 150 lb (68.04 kg)  BMI 24.96 kg/m2  SpO2 97%  LMP 08/12/2011  Physical Exam  Nursing note reviewed. Constitutional: She is oriented to person, place, and time. She appears well-developed and well-nourished.       Vital signs reviewed and are significant upon arrival for tachycardia, hypertension (though validity of diastolic reading is questionable), and fever.  Pt is ill-appearing  HENT:  Head: Normocephalic and atraumatic. No trismus in the jaw.  Right Ear: Hearing, tympanic membrane, external ear and ear canal normal.  Left Ear: Tympanic membrane, external ear and ear canal normal.  Nose: Nose normal.  Mouth/Throat: Uvula is midline and mucous membranes are normal. No uvula swelling. No oropharyngeal exudate.       Bilateral 2+ tonsillar erythema with mild edema. No definitive abscess.  Eyes: Conjunctivae and EOM are normal.  Pupils are equal, round, and reactive to light.  Neck: Normal range of motion. Neck supple. No tracheal deviation present.       TTP AC lymph nodes  Cardiovascular: Regular rhythm and normal heart sounds.  Tachycardia present.   Pulmonary/Chest: Effort normal and breath sounds normal. No stridor. No respiratory distress. She has no wheezes. She exhibits no tenderness.  Abdominal: Soft. Bowel sounds are normal. She exhibits no distension. There is no hepatosplenomegaly. There is Tenderness: mild TTP entire upper abdomen without localization.. There is no rebound and no guarding.  Musculoskeletal: She exhibits no edema and no tenderness.  Lymphadenopathy:    She has cervical adenopathy.  Neurological: She is alert and oriented to person, place, and time. No cranial nerve deficit. Coordination normal.  Skin: Skin is warm and dry. No rash noted.  Psychiatric:       Anxious    ED Course  Procedures (including critical care time)  Labs Reviewed  CBC - Abnormal; Notable for the following:    WBC 21.8 (*)    All other components within normal limits  POCT I-STAT, CHEM 8 - Abnormal; Notable for the following:    Potassium 3.0 (*)    BUN 4 (*)    Glucose, Bld 114 (*)    Calcium, Ion 1.08 (*)    Hemoglobin 15.3 (*)    All other components within normal limits   Ct Soft Tissue Neck W Contrast  08/27/2011  *RADIOLOGY REPORT*  Clinical Data: Sore throat.  CT NECK WITH CONTRAST  Technique:  Multidetector CT imaging of the neck was performed with intravenous contrast.  Contrast: OMNIPAQUE IOHEXOL 300 MG/ML IV SOLN  Comparison: None.  Findings: Diffusely enlarged inflamed palatine tonsils with narrowing of the air column consistent with tonsillitis. No clear breakthrough into the parapharyngeal space or retropharyngeal space.  Enlargement of the adenoidal tissue.  Diffuse adenopathy throughout the neck with largest lymph nodes in the level II region measuring up to 2.3 cm.  Lymph nodes felt to be  reactive in origin with lymphoma a less likely consideration to be considered if the patient did not respond to treatment of tonsillitis.  Visualized intracranial structures and orbital structures unremarkable.  Minimal mucosal thickening inferior aspect of the maxillary sinuses.  Lung apices clear.  Lobular soft tissue structure anterior superior mediastinum/thoracic possibly represents thymic tissue but is incompletely assessed on the present exam, has two components and appears slightly lobulated.  For further assessment, MR with attention to this region recommended after the patients tonsillitis has completely cleared.  No focal thyroid lesion noted.  IMPRESSION: Diffusely enlarged inflamed palatine tonsils with narrowing of the air column consistent with tonsillitis. No clear breakthrough into the parapharyngeal space or retropharyngeal space.  Diffuse adenopathy throughout the neck felt to be reactive in origin  Lobular soft tissue structure anterior superior mediastinum/thoracic possibly represents thymic tissue but is incompletely assessed on the present exam, has two components and appears slightly lobulated.  For further assessment, MR with attention to this region recommended  after the patients tonsillitis has completely cleared.  Original Report Authenticated By: Fuller Canada, M.D.     1. Tonsillitis   2. Hypokalemia   3. Nausea and vomiting in adult       MDM  11:00 AM Pharyngitis with fever, vomiting. Fluids, zofran, pain medication, steroid, tylenol have been ordered. CT neck ordered to eval for retropharyngeal/tonsillar abscess given altered voice, diffuse neck tenderness, lack of exudate in an otherwise mildly edematous erythematous pharynx.   1:50 PM PCN tx to cover for strep given pt symptoms and WBC count. CT scan result reviewed, c/w tonsillitis. Also with anterior mediastinal vs thoracic soft tissue structure with recommended re-eval with MR after tonsillitis assessment-  this has been discussed with pt. Labs with leukocytosis, hypokalemia. Will attempt oral trial.   3:55 PM Pt has tolerated oral trial. No vomiting while in ED. AF, VSS.  Is comfortable with d/c home. Will send with pain and nausea medication. Advised f/u with PCP in 1 week for recheck or sooner if needed. Discussed return precautions.         Shaaron Adler, [place] 08/27/11 [phone]

## 2011-08-28 NOTE — Telephone Encounter (Signed)
Left message for pt to call back.  Father wants the phone number at his house changed to her number which is [phone].

## 2011-08-28 NOTE — Telephone Encounter (Signed)
Spoke to pt and she is going to call us as soon as she is feeling better to discuss further testing.  Is feeling better today.

## 2011-08-28 NOTE — Telephone Encounter (Addendum)
Contact patient  To see how she is doing.   Tell her :  Her culture came back" non group A strep" . THis is not  "strep throat " This germ usually resolves on its own without antibiotic however in severe cases like yours antibiotics are used.  These germs are more common to cause infection in the  College age group.   The imaging study disc poss f/u study after she is better .  Plan ROV next week . ( can work in around class schedule.)

## 2011-09-08 ENCOUNTER — Encounter: Payer: Self-pay | Admitting: Internal Medicine

## 2011-09-08 ENCOUNTER — Ambulatory Visit (INDEPENDENT_AMBULATORY_CARE_PROVIDER_SITE_OTHER): Payer: BC Managed Care – PPO | Admitting: Internal Medicine

## 2011-09-08 VITALS — BP 100/70 | HR 84 | Temp 98.6°F | Wt 154.0 lb

## 2011-09-08 DIAGNOSIS — J029 Acute pharyngitis, unspecified: Secondary | ICD-10-CM

## 2011-09-08 DIAGNOSIS — J039 Acute tonsillitis, unspecified: Secondary | ICD-10-CM

## 2011-09-08 DIAGNOSIS — R9389 Abnormal findings on diagnostic imaging of other specified body structures: Secondary | ICD-10-CM

## 2011-09-08 MED ORDER — CEPHALEXIN 500 MG PO CAPS: 500.0000 mg | ORAL_CAPSULE | Freq: Three times a day (TID) | ORAL | Status: AC

## 2011-09-08 NOTE — Patient Instructions (Addendum)
Take antibiotic for what I believe is residual tonsil infection. Give the medicine causes some nausea you can decrease to twice a day instead of 3 times a day.   Summary we'll contact you about an ear nose and throat referral in regard to your tonsillitis and recurrent throat pain.  We will schedule for about a month from now the MR of the top of your chest as discussed. This may be just an incidental finding but needs to be followed up.  Call in the meantime if any fever or relapse.

## 2011-09-08 NOTE — Progress Notes (Signed)
  Subjective:    Patient ID: Jacqueline Griffith, female    DOB: 08-03-1990, 21 y.o.   MRN: [id number]  HPI Patient comes in today for followup from severe tonsillitis that required emergency room evaluation with CT scan of the neck noted to have a white blood cell count in the 20,000 treated with IM penicillin. She states she's a lot better but still feels tight in her throat no fevers nausea vomiting rashes.  She relates that she had recurrent sore throats when she was much younger and gets them about twice a month now but they go away and the day or 2. Ask about whether her tonsils should be removed.  In addition CT scan of the neck revealed tonsillitis with reactive adenopathy but some lobulated tissue in the anterior mediastinum. Radiologist recommended followup MR after resolution of infection. 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   Review of Systems No fever or vomiting vision changes unusual rashes bleeding as per history of present illness  Past history family history social history reviewed in the electronic medical record.     Objective:   Physical Exam wdwn in nad  HEENT: Normocephalic ;atraumatic , Eyes;  PERRL, EOMs  Full, lids and conjunctiva clear,,Ears: no deformities, canals nl, TM landmarks normal, Nose: no deformity or discharge  Mouth : OP tonsils 1-2+ still +1 red but no exudate. Neck supple +1 a.c. nodes mobile minimally tender to not no significant PC nodes. Skin: normal capillary refill ,turgor , color: No acute rashes ,petechiae or bruising  Review of emergency room evaluation CT report. Her culture from there showed a non-group A beta-hemolytic strep     Assessment & Plan:  Convalescing acute tonsillitis presumed NON gpA  beta-hemolytic cause. In the setting of recurrent sore throats per patient hx .  She still has residual swelling  and redness on my exam we'll treat with Adalat to [facility] positive Keflex treatment. ENT consult.   Abnormal CT scan of the neck anterior mediastinal area will followup MR in about a month's time.

## 2011-09-12 ENCOUNTER — Other Ambulatory Visit: Payer: Self-pay | Admitting: Internal Medicine

## 2011-09-12 DIAGNOSIS — R9389 Abnormal findings on diagnostic imaging of other specified body structures: Secondary | ICD-10-CM

## 2011-10-03 ENCOUNTER — Ambulatory Visit (INDEPENDENT_AMBULATORY_CARE_PROVIDER_SITE_OTHER)
Admission: RE | Admit: 2011-10-03 | Discharge: 2011-10-03 | Disposition: A | Discharge status: Home / Self Care | Payer: BC Managed Care – PPO | Source: Ambulatory Visit | Attending: Internal Medicine | Admitting: Internal Medicine

## 2011-10-03 ENCOUNTER — Other Ambulatory Visit: Payer: Self-pay | Admitting: Internal Medicine

## 2011-10-03 ENCOUNTER — Other Ambulatory Visit: Payer: BC Managed Care – PPO

## 2011-10-03 DIAGNOSIS — R9389 Abnormal findings on diagnostic imaging of other specified body structures: Secondary | ICD-10-CM

## 2011-10-03 MED ORDER — IOHEXOL 300 MG/ML  SOLN
80.0000 mL | Freq: Once | INTRAMUSCULAR | Status: AC | PRN
  Administered 2011-10-03: 80 mL via INTRAVENOUS

## 2011-10-07 ENCOUNTER — Other Ambulatory Visit: Payer: BC Managed Care – PPO

## 2011-10-08 NOTE — Progress Notes (Signed)
Quick Note:  Pt aware of results. ______ 

## 2012-10-12 ENCOUNTER — Encounter: Payer: Self-pay | Admitting: Internal Medicine

## 2012-10-12 ENCOUNTER — Ambulatory Visit (INDEPENDENT_AMBULATORY_CARE_PROVIDER_SITE_OTHER): Payer: BC Managed Care – PPO | Admitting: Internal Medicine

## 2012-10-12 VITALS — BP 110/72 | HR 85 | Temp 98.5°F | Wt 137.0 lb

## 2012-10-12 DIAGNOSIS — R198 Other specified symptoms and signs involving the digestive system and abdomen: Secondary | ICD-10-CM

## 2012-10-12 DIAGNOSIS — R9389 Abnormal findings on diagnostic imaging of other specified body structures: Secondary | ICD-10-CM

## 2012-10-12 DIAGNOSIS — K319 Disease of stomach and duodenum, unspecified: Secondary | ICD-10-CM

## 2012-10-12 DIAGNOSIS — R194 Change in bowel habit: Secondary | ICD-10-CM | POA: Insufficient documentation

## 2012-10-12 NOTE — Patient Instructions (Addendum)
Let you know about lab tests when they are available. We'll then plan to gastroenterology opinion.  Consider lactose intolerance bowel overgrowth and/or irritable bowel.  Sometime we treat with an antibiotic   For now can try a probiotic  To see if helps your problem .   I will review your record about whether or we should do the upper chest imaging study as followup of her last scan.

## 2012-10-12 NOTE — Progress Notes (Signed)
Chief Complaint  Patient presents with  . Frequent bowel movements    5-6 times daily.  Was taking Accutane and thinks this could be related.  Is worried she has Chrohns Disease.  Would like to revisit her throat and tonsil problems.    HPI: Patient comes in today  for   problem evaluation.last OV was over a year ago  4 short while there was family stress but now is coming back in because she has concerns about a number of symptoms that have been ongoing. She realized there could be something going on when she got her eyes checked after having  Vision changes and glasses helps her a lot with headaches .   Now  Asks about gi problems   Bowel frequency    5-6 times per day since early HS.   Now concern about underling problem  since being on accutane .      Wonders if she should have a colonoscopy.   Was hospitalized 2007  In [place] for stomach problem And was given some medicine and  Had [place].      Wasn't able to see  ent cause of family problems. Last year. Thinks her throat might be okay new symptoms uncertain if she should followup. No dysphasia no vomiting    rx for depression in August.  Medication Prozac was changed to [place]. She is also on Vyvanse 20 mg a day ROS: See pertinent positives and negatives per HPI. Some weight loss  ?  Intentional ?  no blood in  Stool       Family hx lactose  intolerance  ? If IBS.    Hs or before.   Stomach aches.   Possibly improved somewhat of a lactose-free diet  Just moved in with parents and to move to [place] in June for school  English major   About a [employer] representative . 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  Past Medical History  Diagnosis Date  . Acne     hx accutane use   . ADHD (attention deficit hyperactivity disorder)   . Depression   . Thyromegaly   . Frostbite     toes  . H/O: substance abuse     No family history on file.  History   Social History  . Marital Status: Single    Spouse  Name: N/A    Number of Children: N/A  . Years of Education: N/A   Social History Main Topics  . Smoking status: Former Smoker    Quit date: 06/13/2009  . Smokeless tobacco: None  . Alcohol Use: Yes     Comment: Occ.  . Drug Use: None  . Sexually Active: None   Other Topics Concern  . None   Social History Narrative   Daily Caffeine   [employer] student   Quit tobacco 12 /10   Lives near campus                Outpatient Encounter Prescriptions as of 10/12/2012  Medication Sig Dispense Refill  . desvenlafaxine (PRISTIQ) 100 MG 24 hr tablet Take 100 mg by mouth daily.      Marland Kitchen lisdexamfetamine (VYVANSE) 20 MG capsule Take 20 mg by mouth every morning.      . [DISCONTINUED] FLUoxetine (PROZAC) 40 MG capsule Take 40 mg by mouth daily.       No facility-administered encounter medications on file as of 10/12/2012.    EXAM:  BP 110/72  Pulse  85  Temp(Src) 98.5 F (36.9 C)  Wt 137 lb (62.143 kg)  BMI 22.8 kg/m2  SpO2 98%  Body mass index is 22.8 kg/(m^2).  GENERAL: vitals reviewed and listed above, alert, oriented, appears well hydrated and in no acute distress wearing glasses  HEENT: atraumatic, conjunctiva  clear, no obvious abnormalities on inspection of external nose and ears OP : no lesion edema or exudate tonsils 0 to +1 no exudate or edema good airway  NECK: no obvious masses on inspection palpation shoddy a.c. nodes  LUNGS: clear to auscultation bilaterally, no wheezes, rales or rhonchi, good air movement  CV: HRRR no gallops or murmurs, no clubbing cyanosis or  peripheral edema nl cap refill  Abdomen:  Sof,t normal bowel sounds without hepatosplenomegaly, no guarding rebound or masses no CVA tenderness Skin: normal capillary refill ,turgor , color: No acute rashes ,petechiae or bruising  MS: moves all extremities without noticeable focal  abnormality  PSYCH: pleasant and cooperative, no obvious depression or anxiety no tremor   ASSESSMENT AND PLAN:  Discussed the  following assessment and plan:  Frequent bowel movements - Plan: Basic metabolic panel, CBC with Differential, Hepatic function panel, TSH, T4, free, Lipid panel, Celiac panel 10, Vitamin B12, Sedimentation rate, Ambulatory referral to Gastroenterology  Stomach problems - Plan: Basic metabolic panel, CBC with Differential, Hepatic function panel, TSH, T4, free, Lipid panel, Celiac panel 10, Vitamin B12, Sedimentation rate, Ambulatory referral to Gastroenterology  Abnormal finding on  scan no fu needed - Noted incidentally upper anterior mediastinum. CT scan reviewed no significant abnormality Don't really find abnormal findings on her ENT exam today and she doesn't admit to current sore throats.    I reviewed the record  After pt left and the CT scan showed nothing of concern no further imaging needed. 1. Small cluster of peribronchovascular micronodularity in the  medial segment of the right middle lobe, where there is also a  region of atelectasis and/or scarring. Findings are presumably  secondary to infection/inflammation in this young patient (likely  an area of resolving bronchopneumonia).  2. Small amount of soft tissue in the anterior mediastinum is  compatible with a small amount of residual thymic tissue in this  young patient.  Original Report Authenticated By: Florencia Reasons, M.D. 3 17   -Patient advised to return or notify health care team  if symptoms worsen or persist or new concerns arise.  Patient Instructions  Let you know about lab tests when they are available. We'll then plan to gastroenterology opinion.  Consider lactose intolerance bowel overgrowth and/or irritable bowel.  Sometime we treat with an antibiotic   For now can try a probiotic  To see if helps your problem .   I will review your record about whether or we should do the upper chest imaging study as followup of her last scan.     Neta Mends. Panosh M.D.

## 2012-10-13 ENCOUNTER — Encounter: Payer: Self-pay | Admitting: Internal Medicine

## 2012-10-13 LAB — CBC WITH DIFFERENTIAL/PLATELET
Basophils Absolute: 0.1 10*3/uL (ref 0.0–0.1)
Eosinophils Absolute: 0.1 10*3/uL (ref 0.0–0.7)
HCT: 41.2 % (ref 36.0–46.0)
Hemoglobin: 13.9 g/dL (ref 12.0–15.0)
Lymphs Abs: 2.3 10*3/uL (ref 0.7–4.0)
MCHC: 33.6 g/dL (ref 30.0–36.0)
Monocytes Relative: 3.8 % (ref 3.0–12.0)
Neutro Abs: 5.9 10*3/uL (ref 1.4–7.7)
RDW: 13.1 % (ref 11.5–14.6)

## 2012-10-13 LAB — HEPATIC FUNCTION PANEL
AST: 24 U/L (ref 0–37)
Albumin: 4.7 g/dL (ref 3.5–5.2)
Total Protein: 7.8 g/dL (ref 6.0–8.3)

## 2012-10-13 LAB — TSH: TSH: 0.11 u[IU]/mL — ABNORMAL LOW (ref 0.35–5.50)

## 2012-10-13 LAB — LIPID PANEL
HDL: 54.1 mg/dL (ref >39.00)
LDL Cholesterol: 67 mg/dL (ref 0–99)
Total CHOL/HDL Ratio: 2
VLDL: 11.6 mg/dL (ref 0.0–40.0)

## 2012-10-13 LAB — BASIC METABOLIC PANEL
CO2: 27 mEq/L (ref 19–32)
Glucose, Bld: 88 mg/dL (ref 70–99)
Potassium: 3.7 mEq/L (ref 3.5–5.1)
Sodium: 136 mEq/L (ref 135–145)

## 2012-10-13 LAB — SEDIMENTATION RATE: Sed Rate: 4 mm/hr (ref 0–22)

## 2012-10-13 LAB — CELIAC PANEL 10
Gliadin IgA: 3.4 U/mL (ref <20)
IgA: 183 mg/dL (ref 69–380)

## 2012-10-19 ENCOUNTER — Encounter: Payer: Self-pay | Admitting: Internal Medicine

## 2012-10-22 ENCOUNTER — Other Ambulatory Visit (INDEPENDENT_AMBULATORY_CARE_PROVIDER_SITE_OTHER): Payer: BC Managed Care – PPO

## 2012-10-22 DIAGNOSIS — E039 Hypothyroidism, unspecified: Secondary | ICD-10-CM

## 2012-10-22 LAB — TSH: TSH: 0.44 u[IU]/mL (ref 0.35–5.50)

## 2012-10-22 LAB — T3, FREE: T3, Free: 2.9 pg/mL (ref 2.3–4.2)

## 2012-10-22 LAB — T4, FREE: Free T4: 1 ng/dL (ref 0.60–1.60)

## 2012-10-25 ENCOUNTER — Encounter: Payer: Self-pay | Admitting: Family Medicine

## 2012-11-04 ENCOUNTER — Encounter: Payer: Self-pay | Admitting: Internal Medicine

## 2012-11-08 ENCOUNTER — Ambulatory Visit: Payer: BC Managed Care – PPO | Admitting: Internal Medicine

## 2013-05-19 ENCOUNTER — Other Ambulatory Visit: Payer: Self-pay

## 2013-12-29 ENCOUNTER — Ambulatory Visit: Payer: BC Managed Care – PPO | Admitting: Sports Medicine

## 2014-02-27 ENCOUNTER — Ambulatory Visit (INDEPENDENT_AMBULATORY_CARE_PROVIDER_SITE_OTHER): Payer: BC Managed Care – PPO | Admitting: Sports Medicine

## 2014-02-27 ENCOUNTER — Encounter: Payer: Self-pay | Admitting: Sports Medicine

## 2014-02-27 VITALS — BP 94/63 | HR 56 | Ht 66.0 in | Wt 143.0 lb

## 2014-02-27 DIAGNOSIS — M2142 Flat foot [pes planus] (acquired), left foot: Principal | ICD-10-CM

## 2014-02-27 DIAGNOSIS — M2140 Flat foot [pes planus] (acquired), unspecified foot: Secondary | ICD-10-CM

## 2014-02-27 DIAGNOSIS — M2141 Flat foot [pes planus] (acquired), right foot: Secondary | ICD-10-CM

## 2014-02-27 NOTE — Progress Notes (Signed)
   Subjective:    Patient ID: Jacqueline Griffith, female    DOB: 11/28/1990, 23 y.o.   MRN: [id number]  HPI chief complaint: "Collapsed arches" and low back pain  Very pleasant 23 year old female comes in today complaining of chronic low back pain. Pain is in the center of her lumbar spine and present only with running. It was suggested that she try some orthotics for her "collapsed arches". Her mom who accompanies her today has had good success with green sports insoles and scaphoid pads for pes cavus. Other than running, the patient has no low back pain. She denies radiating pain into her legs. No numbness or tingling. No imaging.  Past medical history reviewed. Current medications reviewed. Allergies reviewed.    Review of Systems     Objective:   Physical Exam Well-developed, well-nourished. No acute distress. Awake alert and oriented x3. Vital signs reviewed  Lumbar spine: Full painless lumbar range of motion. No spasm. Bilateral hamstring tightness. Neurological exam: Equal reflexes at the Achilles and patellar tendons. No atrophy Ankles: 2+ talar tilt on the right, 1+ talar tilt on the left. Negative anterior drawer bilaterally.  Examination of her feet shows pes planus, right greater than left. Running form shows her to be a forefoot Striker with some heel wabble. She pronates slightly when running. Good hip abductor strength bilaterally.       Assessment & Plan:  Low back pain Pes planus  We will try some green sports insoles and scaphoid pads. Of note, her mom has had custom orthotics and feels more comfortable with her green sports insoles. Therefore, we will not schedule a followup for custom orthotics but will instead leave it up to the patient and her mother whether or not they would like to return at a later date for these. She is shown some ankle strengthening exercises. She may continue with activity as tolerated. Followup when necessary.

## 2017-04-03 ENCOUNTER — Encounter: Payer: Self-pay | Admitting: Internal Medicine

## 2017-07-14 HISTORY — PX: HIP SURGERY: SHX245

## 2024-03-17 ENCOUNTER — Other Ambulatory Visit: Payer: Self-pay

## 2024-03-17 ENCOUNTER — Encounter (HOSPITAL_COMMUNITY): Payer: Self-pay

## 2024-03-17 ENCOUNTER — Emergency Department (HOSPITAL_COMMUNITY)
Admission: EM | Admit: 2024-03-17 | Discharge: 2024-03-17 | Disposition: A | Discharge status: Home / Self Care | Attending: Emergency Medicine | Admitting: Emergency Medicine

## 2024-03-17 DIAGNOSIS — E860 Dehydration: Secondary | ICD-10-CM | POA: Diagnosis not present

## 2024-03-17 DIAGNOSIS — F10929 Alcohol use, unspecified with intoxication, unspecified: Secondary | ICD-10-CM

## 2024-03-17 DIAGNOSIS — E872 Acidosis, unspecified: Secondary | ICD-10-CM

## 2024-03-17 DIAGNOSIS — Z87891 Personal history of nicotine dependence: Secondary | ICD-10-CM | POA: Insufficient documentation

## 2024-03-17 DIAGNOSIS — F10129 Alcohol abuse with intoxication, unspecified: Secondary | ICD-10-CM | POA: Insufficient documentation

## 2024-03-17 DIAGNOSIS — E162 Hypoglycemia, unspecified: Secondary | ICD-10-CM | POA: Diagnosis not present

## 2024-03-17 DIAGNOSIS — E8729 Other acidosis: Secondary | ICD-10-CM | POA: Insufficient documentation

## 2024-03-17 DIAGNOSIS — R1110 Vomiting, unspecified: Secondary | ICD-10-CM | POA: Diagnosis present

## 2024-03-17 LAB — CBC
HCT: 42.8 % (ref 36.0–46.0)
Hemoglobin: 14.3 g/dL (ref 12.0–15.0)
MCH: 30.4 pg (ref 26.0–34.0)
MCHC: 33.4 g/dL (ref 30.0–36.0)
MCV: 90.9 fL (ref 80.0–100.0)
Platelets: 289 K/uL (ref 150–400)
RBC: 4.71 MIL/uL (ref 3.87–5.11)
RDW: 12.6 % (ref 11.5–15.5)
WBC: 16 K/uL — ABNORMAL HIGH (ref 4.0–10.5)
nRBC: 0 % (ref 0.0–0.2)

## 2024-03-17 LAB — URINALYSIS, ROUTINE W REFLEX MICROSCOPIC
Bilirubin Urine: NEGATIVE
Glucose, UA: NEGATIVE mg/dL
Hgb urine dipstick: NEGATIVE
Ketones, ur: 80 mg/dL — AB
Leukocytes,Ua: NEGATIVE
Nitrite: NEGATIVE
Protein, ur: 30 mg/dL — AB
Specific Gravity, Urine: 1.021 (ref 1.005–1.030)
pH: 5 (ref 5.0–8.0)

## 2024-03-17 LAB — BASIC METABOLIC PANEL WITH GFR
Anion gap: 19 — ABNORMAL HIGH (ref 5–15)
Anion gap: 16 — ABNORMAL HIGH (ref 5–15)
BUN: 10 mg/dL (ref 6–20)
BUN: 11 mg/dL (ref 6–20)
CO2: 18 mmol/L — ABNORMAL LOW (ref 22–32)
CO2: 19 mmol/L — ABNORMAL LOW (ref 22–32)
Calcium: 8 mg/dL — ABNORMAL LOW (ref 8.9–10.3)
Calcium: 8.2 mg/dL — ABNORMAL LOW (ref 8.9–10.3)
Chloride: 104 mmol/L (ref 98–111)
Chloride: 102 mmol/L (ref 98–111)
Creatinine, Ser: 0.72 mg/dL (ref 0.44–1.00)
Creatinine, Ser: 0.73 mg/dL (ref 0.44–1.00)
GFR, Estimated: >60 mL/min (ref >60)
GFR, Estimated: >60 mL/min (ref >60)
Glucose, Bld: 62 mg/dL — ABNORMAL LOW (ref 70–99)
Glucose, Bld: 85 mg/dL (ref 70–99)
Potassium: 4.1 mmol/L (ref 3.5–5.1)
Potassium: 4 mmol/L (ref 3.5–5.1)
Sodium: 140 mmol/L (ref 135–145)
Sodium: 137 mmol/L (ref 135–145)

## 2024-03-17 LAB — COMPREHENSIVE METABOLIC PANEL WITH GFR
ALT: 21 U/L (ref 0–44)
AST: 31 U/L (ref 15–41)
Albumin: 4.9 g/dL (ref 3.5–5.0)
Alkaline Phosphatase: 48 U/L (ref 38–126)
Anion gap: 24 — ABNORMAL HIGH (ref 5–15)
BUN: 12 mg/dL (ref 6–20)
CO2: 18 mmol/L — ABNORMAL LOW (ref 22–32)
Calcium: 9.4 mg/dL (ref 8.9–10.3)
Chloride: 99 mmol/L (ref 98–111)
Creatinine, Ser: 0.82 mg/dL (ref 0.44–1.00)
GFR, Estimated: >60 mL/min (ref >60)
Glucose, Bld: 76 mg/dL (ref 70–99)
Potassium: 3.7 mmol/L (ref 3.5–5.1)
Sodium: 141 mmol/L (ref 135–145)
Total Bilirubin: 0.6 mg/dL (ref 0.0–1.2)
Total Protein: 7.6 g/dL (ref 6.5–8.1)

## 2024-03-17 LAB — HCG, SERUM, QUALITATIVE: Preg, Serum: NEGATIVE

## 2024-03-17 LAB — I-STAT CG4 LACTIC ACID, ED
Lactic Acid, Venous: 4.8 mmol/L (ref 0.5–1.9)
Lactic Acid, Venous: 3.3 mmol/L (ref 0.5–1.9)
Lactic Acid, Venous: 1.1 mmol/L (ref 0.5–1.9)

## 2024-03-17 LAB — BETA-HYDROXYBUTYRIC ACID: Beta-Hydroxybutyric Acid: 1.61 mmol/L — ABNORMAL HIGH (ref 0.05–0.27)

## 2024-03-17 LAB — CBG MONITORING, ED: Glucose-Capillary: 67 mg/dL — ABNORMAL LOW (ref 70–99)

## 2024-03-17 LAB — LIPASE, BLOOD: Lipase: 35 U/L (ref 11–51)

## 2024-03-17 MED ORDER — ONDANSETRON HCL 4 MG/2ML IJ SOLN
4.0000 mg | Freq: Once | INTRAMUSCULAR | Status: AC
  Administered 2024-03-17: 4 mg via INTRAVENOUS
  Filled 2024-03-17: qty 2

## 2024-03-17 MED ORDER — LACTATED RINGERS IV BOLUS
1000.0000 mL | Freq: Once | INTRAVENOUS | Status: AC
  Administered 2024-03-17: 1000 mL via INTRAVENOUS

## 2024-03-17 MED ORDER — SODIUM CHLORIDE 0.9 % IV BOLUS
1000.0000 mL | Freq: Once | INTRAVENOUS | Status: AC
  Administered 2024-03-17: 1000 mL via INTRAVENOUS

## 2024-03-17 MED ORDER — DEXTROSE IN LACTATED RINGERS 5 % IV SOLN: INTRAVENOUS | Status: DC

## 2024-03-17 MED ORDER — DEXTROSE 5 % IV SOLN: Freq: Once | INTRAVENOUS | Status: DC

## 2024-03-17 MED ORDER — ONDANSETRON 4 MG PO TBDP: 4.0000 mg | ORAL_TABLET | Freq: Three times a day (TID) | ORAL | 0 refills | Status: DC | PRN

## 2024-03-17 NOTE — Discharge Instructions (Addendum)
 We evaluated you for your nausea and vomiting.  Your laboratory test showed that you had elevated ketones and signs of significant dehydration.  We gave you medicine in the emergency department which has improved your symptoms and improved your abnormal laboratory testing.  Please try to avoid drinking this much alcohol in the future.  We have prescribed you nausea medication which you can take every 8 hours as needed for nausea or vomiting.  Please start with a light diet like clear liquids with sugar, such as Gatorade.  If you feel this goes okay, you can advance your diet to bland food.  Please return to the emergency department if you have any new or worsening symptoms.  Also discussed that you have been feeling under a lot of stress.  We have attached some resources.  If you develop any other symptoms such as severe depression or thoughts of wanting to harm yourself, please return to the emergency department at the behavioral health urgent care.

## 2024-03-17 NOTE — ED Triage Notes (Signed)
 Pt states she drank too much tequila last night. States she has about 10 shots last night, states she does not normally drink that much. Has been vomiting all night unable to hold anything down, no diarrhea.

## 2024-03-17 NOTE — ED Provider Notes (Signed)
 [place] EMERGENCY DEPARTMENT AT [facility] Provider Note  CSN: [id number] Arrival date & time: 03/17/24 9186  Chief Complaint(s) Emesis  HPI Jacqueline Griffith is a 33 y.o. female history of depression presenting to the emergency department with vomiting.  Patient reports that last night she drank a substantial amount of tequila.  She reports that she has been under a lot of stress.  She reports that this morning has been constantly vomiting and not able to keep anything down.  She reports some mild cramping in her belly but no other symptoms.  No fevers or chills.  No hematemesis or melena.  No hematochezia.  No chest pain or shortness of breath.  She denies any suicidal ideation or intentional self-harm, denies medication ingestion.   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 Authorizing Provider  ondansetron  (ZOFRAN -ODT) 4 MG disintegrating tablet Take 1 tablet (4 mg total) by mouth every 8 (eight) hours as needed for nausea or vomiting. 03/17/24  Yes Francesca Elsie CROME, MD  desvenlafaxine (PRISTIQ) 100 MG 24 hr tablet Take 100 mg by mouth daily.    Lefaive, Michael, NP  escitalopram (LEXAPRO) 20 MG tablet Take 20 mg by mouth  daily.    [provider]  lisdexamfetamine (VYVANSE) 20 MG capsule Take 20 mg by mouth every morning.    [provider]                                                                                                                                    Past Surgical History History reviewed. No pertinent surgical history. Family History History reviewed. No pertinent family history.  Social History Social History   Tobacco Use   Smoking status: Former    Current packs/day: 0.00    Types: Cigarettes    Quit date: 06/13/2009    Years since quitting: 14.7  Substance Use Topics   Alcohol use: Yes    Comment: Occ.   Allergies Patient has no known allergies.  Review of Systems Review of Systems  All other systems reviewed and are negative.  Physical Exam Vital Signs  I have reviewed the triage vital signs BP 94/64 (BP Location: Right Arm)   Pulse 71   Temp 97.9 F (36.6 C) (Oral)   Resp 18   SpO2 100%  Physical Exam Vitals and nursing note reviewed.  Constitutional:      General: She is not in acute distress.    Appearance: She is well-developed.  HENT:     Head: Normocephalic and atraumatic.     Mouth/Throat:     Mouth: Mucous membranes are moist.  Eyes:     Pupils: Pupils are equal, round, and reactive to light.  Cardiovascular:     Rate and Rhythm: Normal rate and regular rhythm.     Heart sounds: No murmur heard. Pulmonary:     Effort: Pulmonary effort is normal. No respiratory distress.     Breath sounds: Normal breath sounds.  Abdominal:     General: Abdomen is flat.     Palpations: Abdomen is soft.     Tenderness: There is no abdominal tenderness.  Musculoskeletal:        General: No tenderness.     Right lower leg: No edema.     Left lower leg: No edema.  Skin:    General: Skin is warm and dry.  Neurological:     General: No focal deficit present.     Mental Status: She is alert. Mental status is at baseline.  Psychiatric:         Mood and Affect: Mood normal. Affect is tearful.        Behavior: Behavior normal.        Thought Content: Thought content does not include homicidal or suicidal ideation. Thought content does not include homicidal or suicidal plan.     ED Results and Treatments Labs (all labs ordered are listed, but only abnormal results are displayed) Labs Reviewed  COMPREHENSIVE METABOLIC PANEL WITH GFR - Abnormal; Notable for the following components:      Result Value   CO2 18 (*)    Anion gap 24 (*)    All other components within normal limits  CBC - Abnormal; Notable for the following components:   WBC 16.0 (*)    All other components within normal limits  URINALYSIS, ROUTINE W REFLEX MICROSCOPIC - Abnormal; Notable for the following components:   APPearance HAZY (*)    Ketones, ur 80 (*)    Protein, ur 30 (*)    Bacteria, UA RARE (*)    All other components within normal limits  BETA-HYDROXYBUTYRIC ACID - Abnormal; Notable for the following components:   Beta-Hydroxybutyric Acid 1.61 (*)    All other components within normal limits  BASIC METABOLIC PANEL WITH GFR - Abnormal; Notable for the following components:   CO2 18 (*)    Glucose, Bld 62 (*)    Calcium 8.0 (*)    Anion gap 19 (*)    All other components within normal limits  BASIC METABOLIC PANEL WITH GFR - Abnormal; Notable for the following components:   CO2 19 (*)    Calcium 8.2 (*)    Anion gap 16 (*)    All other components within normal limits  I-STAT CG4 LACTIC ACID, ED - Abnormal; Notable for the following components:   Lactic Acid, Venous 4.8 (*)    All other components within normal limits  I-STAT CG4 LACTIC ACID, ED - Abnormal; Notable for the following components:   Lactic Acid, Venous 3.3 (*)    All other  components within normal limits  CBG MONITORING, ED - Abnormal; Notable for the following components:   Glucose-Capillary 67 (*)    All other components within normal limits  LIPASE, BLOOD  HCG, SERUM,  QUALITATIVE  I-STAT CG4 LACTIC ACID, ED                                                                                                                          Radiology No results found.  Pertinent labs & imaging results that were available during my care of the patient were reviewed by me and considered in my medical decision making (see MDM for details).  Medications Ordered in ED Medications  dextrose  5 % in lactated ringers  infusion (0 mLs Intravenous Stopped 03/17/24 1600)  sodium chloride  0.9 % bolus 1,000 mL (0 mLs Intravenous Stopped 03/17/24 0944)  ondansetron  (ZOFRAN ) injection 4 mg (4 mg Intravenous Given 03/17/24 0906)  sodium chloride  0.9 % bolus 1,000 mL (0 mLs Intravenous Stopped 03/17/24 1110)  lactated ringers  bolus 1,000 mL (0 mLs Intravenous Stopped 03/17/24 1330)  ondansetron  (ZOFRAN ) injection 4 mg (4 mg Intravenous Given 03/17/24 1325)                                                                                                                                     Procedures .Critical Care  Performed by: Francesca Elsie CROME, MD Authorized by: Francesca Elsie CROME, MD   Critical care provider statement:    Critical care time (minutes):  35   Critical care was necessary to treat or prevent imminent or life-threatening deterioration of the following conditions:  Dehydration and metabolic crisis   Critical care was time spent personally by me on the following activities:  Development of treatment plan with patient or surrogate, evaluation of patient's response to treatment, examination of patient, ordering and review of laboratory studies, ordering and review of radiographic studies, ordering and performing treatments and interventions, pulse oximetry, re-evaluation of patient's condition and review of old charts   (including critical care time)  Medical Decision Making / ED Course   MDM:  33 year old presenting to the emergency department with vomiting.  Patient is  overall well-appearing, vital signs are normal.  Physical examination without focal finding.  Presentation seems most consistent with hangover.  Will give IV fluids, Zofran  and reassess.  Symptoms all occurred in relation to excessive alcohol use.  Will check some basic  labs.  Considered other dangerous process such as intra-abdominal process, no tenderness to suggest any focal abnormality like appendicitis or cholecystitis, pancreatitis will check lipase and LFTs.  Considered other dangerous process such as pregnancy related issue but patient denies pregnancy, will check pregnancy test.  Will reassess.  Clinical Course as of 03/17/24 1619  Thu Mar 17, 2024  1017 Lactic Acid, Venous(!!): 4.8 Pt feeling better and tolerating PO. Low concern for underlying infection or ischemia. Will trend. [WS]  1402 Repeat lactate is improved.  Repeat metabolic panel with improved anion gap however glucose is low.  Suspect associated ketoacidosis.  Will give D5.  Patient reports that she has not eaten much in the past couple days also.  Everything so far still seems most consistent with alcohol use with alcoholic ketoacidosis and dehydration.  She reports that she still feels well and denies any other recent symptoms like fevers or chills to suggest any underlying pathology.  Will recheck lactic again as well as BMP.  If continues to improve and patient still feeling well, anticipate discharge. [WS]  1618 Lactic acid is normalized and repeat BMP is reassuring.  Patient is feeling better and tolerating p.o. Will discharge patient to home. All questions answered. Patient comfortable with plan of discharge. Return precautions discussed with patient and specified on the after visit summary.  [WS]    Clinical Course User Index [WS] Francesca Elsie CROME, MD     Additional history obtained: -External records from outside source obtained and reviewed including: Chart review including previous notes, labs, imaging,  consultation notes including pror notes    Lab Tests: -I ordered, reviewed, and interpreted labs.   The pertinent results include:   Labs Reviewed  COMPREHENSIVE METABOLIC PANEL WITH GFR - Abnormal; Notable for the following components:      Result Value   CO2 18 (*)    Anion gap 24 (*)    All other components within normal limits  CBC - Abnormal; Notable for the following components:   WBC 16.0 (*)    All other components within normal limits  URINALYSIS, ROUTINE W REFLEX MICROSCOPIC - Abnormal; Notable for the following components:   APPearance HAZY (*)    Ketones, ur 80 (*)    Protein, ur 30 (*)    Bacteria, UA RARE (*)    All other components within normal limits  BETA-HYDROXYBUTYRIC ACID - Abnormal; Notable for the following components:   Beta-Hydroxybutyric Acid 1.61 (*)    All other components within normal limits  BASIC METABOLIC PANEL WITH GFR - Abnormal; Notable for the following components:   CO2 18 (*)    Glucose, Bld 62 (*)    Calcium 8.0 (*)    Anion gap 19 (*)    All other components within normal limits  BASIC METABOLIC PANEL WITH GFR - Abnormal; Notable for the following components:   CO2 19 (*)    Calcium 8.2 (*)    Anion gap 16 (*)    All other components within normal limits  I-STAT CG4 LACTIC ACID, ED - Abnormal; Notable for the following components:   Lactic Acid, Venous 4.8 (*)    All other components within normal limits  I-STAT CG4 LACTIC ACID, ED - Abnormal; Notable for the following components:   Lactic Acid, Venous 3.3 (*)    All other components within normal limits  CBG MONITORING, ED - Abnormal; Notable for the following components:   Glucose-Capillary 67 (*)    All other components within normal limits  LIPASE, BLOOD  HCG, SERUM, QUALITATIVE  I-STAT CG4 LACTIC ACID, ED    Notable for lactic acidosis, hypoglycemia, ketosis    Medicines ordered and prescription drug management: Meds ordered this encounter  Medications   sodium  chloride 0.9 % bolus 1,000 mL   ondansetron  (ZOFRAN ) injection 4 mg   sodium chloride  0.9 % bolus 1,000 mL   lactated ringers  bolus 1,000 mL   ondansetron  (ZOFRAN ) injection 4 mg   DISCONTD: dextrose  5 % solution   dextrose  5 % in lactated ringers  infusion   ondansetron  (ZOFRAN -ODT) 4 MG disintegrating tablet    Sig: Take 1 tablet (4 mg total) by mouth every 8 (eight) hours as needed for nausea or vomiting.    Dispense:  15 tablet    Refill:  0    -I have reviewed the patients home medicines and have made adjustments as needed   Social Determinants of Health:  Diagnosis or treatment significantly limited by social determinants of health: alcohol use   Reevaluation: After the interventions noted above, I reevaluated the patient and found that their symptoms have resolved  Co morbidities that complicate the patient evaluation  Past Medical History:  Diagnosis Date   Acne    hx accutane use    ADHD (attention deficit hyperactivity disorder)    Depression    Frostbite    toes   H/O: substance abuse (HCC)    Thyromegaly       Dispostion: Disposition decision including need for hospitalization was considered, and patient discharged from emergency department.    Final Clinical Impression(s) / ED Diagnoses Final diagnoses:  Ketoacidosis due to acute alcohol intoxication (HCC)  Lactic acidosis  Dehydration     This chart was dictated using voice recognition software.  Despite best efforts to proofread,  errors can occur which can change the documentation meaning.    Francesca Elsie CROME, MD 03/17/24 [phone]

## 2024-04-22 ENCOUNTER — Ambulatory Visit (HOSPITAL_COMMUNITY)
Admission: EM | Admit: 2024-04-22 | Discharge: 2024-04-22 | Disposition: A | Discharge status: Home / Self Care | Payer: Self-pay

## 2024-05-04 ENCOUNTER — Encounter (HOSPITAL_COMMUNITY): Payer: Self-pay | Admitting: Emergency Medicine

## 2024-05-04 ENCOUNTER — Inpatient Hospital Stay
Admission: AD | Admit: 2024-05-04 | Discharge: 2024-05-11 | DRG: 885 | Disposition: A | Discharge status: Home / Self Care | Source: Other Acute Inpatient Hospital | Attending: Psychiatry | Admitting: Psychiatry

## 2024-05-04 ENCOUNTER — Ambulatory Visit (HOSPITAL_COMMUNITY)
Admission: EM | Admit: 2024-05-04 | Discharge: 2024-05-04 | Disposition: A | Discharge status: Other Acute Inpatient Hospital | Attending: Psychiatry | Admitting: Psychiatry

## 2024-05-04 DIAGNOSIS — F603 Borderline personality disorder: Secondary | ICD-10-CM | POA: Diagnosis present

## 2024-05-04 DIAGNOSIS — Z975 Presence of (intrauterine) contraceptive device: Secondary | ICD-10-CM | POA: Diagnosis not present

## 2024-05-04 DIAGNOSIS — F1590 Other stimulant use, unspecified, uncomplicated: Secondary | ICD-10-CM | POA: Diagnosis present

## 2024-05-04 DIAGNOSIS — Z79899 Other long term (current) drug therapy: Secondary | ICD-10-CM

## 2024-05-04 DIAGNOSIS — F1290 Cannabis use, unspecified, uncomplicated: Secondary | ICD-10-CM | POA: Diagnosis present

## 2024-05-04 DIAGNOSIS — Z87891 Personal history of nicotine dependence: Secondary | ICD-10-CM | POA: Diagnosis not present

## 2024-05-04 DIAGNOSIS — F332 Major depressive disorder, recurrent severe without psychotic features: Secondary | ICD-10-CM | POA: Diagnosis present

## 2024-05-04 DIAGNOSIS — R45851 Suicidal ideations: Secondary | ICD-10-CM | POA: Diagnosis present

## 2024-05-04 DIAGNOSIS — Z9152 Personal history of nonsuicidal self-harm: Secondary | ICD-10-CM

## 2024-05-04 DIAGNOSIS — F909 Attention-deficit hyperactivity disorder, unspecified type: Secondary | ICD-10-CM | POA: Diagnosis present

## 2024-05-04 DIAGNOSIS — Z560 Unemployment, unspecified: Secondary | ICD-10-CM | POA: Diagnosis not present

## 2024-05-04 DIAGNOSIS — F411 Generalized anxiety disorder: Secondary | ICD-10-CM | POA: Diagnosis present

## 2024-05-04 DIAGNOSIS — F4310 Post-traumatic stress disorder, unspecified: Secondary | ICD-10-CM | POA: Diagnosis present

## 2024-05-04 DIAGNOSIS — F339 Major depressive disorder, recurrent, unspecified: Secondary | ICD-10-CM | POA: Diagnosis present

## 2024-05-04 LAB — TSH: TSH: 0.643 u[IU]/mL (ref 0.350–4.500)

## 2024-05-04 LAB — POCT URINE DRUG SCREEN - MANUAL ENTRY (I-SCREEN)
POC Amphetamine UR: POSITIVE — AB
POC Buprenorphine (BUP): NOT DETECTED
POC Cocaine UR: NOT DETECTED
POC Marijuana UR: POSITIVE — AB
POC Methadone UR: NOT DETECTED
POC Methamphetamine UR: NOT DETECTED
POC Morphine: NOT DETECTED
POC Oxazepam (BZO): NOT DETECTED
POC Oxycodone UR: NOT DETECTED
POC Secobarbital (BAR): NOT DETECTED

## 2024-05-04 LAB — CBC WITH DIFFERENTIAL/PLATELET
Abs Immature Granulocytes: 0.02 K/uL (ref 0.00–0.07)
Basophils Absolute: 0 K/uL (ref 0.0–0.1)
Basophils Relative: 1 %
Eosinophils Absolute: 0.1 K/uL (ref 0.0–0.5)
Eosinophils Relative: 1 %
HCT: 44.9 % (ref 36.0–46.0)
Hemoglobin: 15.2 g/dL — ABNORMAL HIGH (ref 12.0–15.0)
Immature Granulocytes: 0 %
Lymphocytes Relative: 27 %
Lymphs Abs: 2 K/uL (ref 0.7–4.0)
MCH: 30.2 pg (ref 26.0–34.0)
MCHC: 33.9 g/dL (ref 30.0–36.0)
MCV: 89.1 fL (ref 80.0–100.0)
Monocytes Absolute: 0.4 K/uL (ref 0.1–1.0)
Monocytes Relative: 5 %
Neutro Abs: 5.1 K/uL (ref 1.7–7.7)
Neutrophils Relative %: 66 %
Platelets: 312 K/uL (ref 150–400)
RBC: 5.04 MIL/uL (ref 3.87–5.11)
RDW: 12.1 % (ref 11.5–15.5)
WBC: 7.6 K/uL (ref 4.0–10.5)
nRBC: 0 % (ref 0.0–0.2)

## 2024-05-04 LAB — COMPREHENSIVE METABOLIC PANEL WITH GFR
ALT: 14 U/L (ref 0–44)
AST: 20 U/L (ref 15–41)
Albumin: 4.7 g/dL (ref 3.5–5.0)
Alkaline Phosphatase: 36 U/L — ABNORMAL LOW (ref 38–126)
Anion gap: 12 (ref 5–15)
BUN: <5 mg/dL — ABNORMAL LOW (ref 6–20)
CO2: 25 mmol/L (ref 22–32)
Calcium: 9.2 mg/dL (ref 8.9–10.3)
Chloride: 98 mmol/L (ref 98–111)
Creatinine, Ser: 1 mg/dL (ref 0.44–1.00)
GFR, Estimated: >60 mL/min (ref >60)
Glucose, Bld: 78 mg/dL (ref 70–99)
Potassium: 4.1 mmol/L (ref 3.5–5.1)
Sodium: 135 mmol/L (ref 135–145)
Total Bilirubin: 0.8 mg/dL (ref 0.0–1.2)
Total Protein: 7.3 g/dL (ref 6.5–8.1)

## 2024-05-04 LAB — HEMOGLOBIN A1C
Hgb A1c MFr Bld: 4.5 % — ABNORMAL LOW (ref 4.8–5.6)
Mean Plasma Glucose: 82.45 mg/dL

## 2024-05-04 LAB — POC URINE PREG, ED: Preg Test, Ur: NEGATIVE

## 2024-05-04 MED ORDER — DIPHENHYDRAMINE HCL 50 MG/ML IJ SOLN: 50.0000 mg | Freq: Three times a day (TID) | INTRAMUSCULAR | Status: DC | PRN

## 2024-05-04 MED ORDER — TRAZODONE HCL 50 MG PO TABS: 50.0000 mg | ORAL_TABLET | Freq: Every evening | ORAL | Status: DC | PRN

## 2024-05-04 MED ORDER — HALOPERIDOL LACTATE 5 MG/ML IJ SOLN: 10.0000 mg | Freq: Three times a day (TID) | INTRAMUSCULAR | Status: DC | PRN

## 2024-05-04 MED ORDER — DEXMETHYLPHENIDATE HCL ER 5 MG PO CP24
20.0000 mg | ORAL_CAPSULE | Freq: Every day | ORAL | Status: DC
  Administered 2024-05-04: 20 mg via ORAL
  Filled 2024-05-04: qty 4

## 2024-05-04 MED ORDER — DEXTROMETHORPHAN-BUPROPION ER 45-105 MG PO TBCR
1.0000 | EXTENDED_RELEASE_TABLET | Freq: Two times a day (BID) | ORAL | Status: DC
  Administered 2024-05-04: 1 via ORAL

## 2024-05-04 MED ORDER — HALOPERIDOL 5 MG PO TABS: 5.0000 mg | ORAL_TABLET | Freq: Three times a day (TID) | ORAL | Status: DC | PRN

## 2024-05-04 MED ORDER — DIPHENHYDRAMINE HCL 50 MG PO CAPS: 50.0000 mg | ORAL_CAPSULE | Freq: Three times a day (TID) | ORAL | Status: DC | PRN

## 2024-05-04 MED ORDER — LORAZEPAM 2 MG/ML IJ SOLN: 2.0000 mg | Freq: Three times a day (TID) | INTRAMUSCULAR | Status: DC | PRN

## 2024-05-04 MED ORDER — MAGNESIUM HYDROXIDE 400 MG/5ML PO SUSP: 30.0000 mL | Freq: Every day | ORAL | Status: DC | PRN

## 2024-05-04 MED ORDER — HYDROXYZINE HCL 25 MG PO TABS: 25.0000 mg | ORAL_TABLET | Freq: Three times a day (TID) | ORAL | Status: DC | PRN

## 2024-05-04 MED ORDER — ACETAMINOPHEN 325 MG PO TABS: 650.0000 mg | ORAL_TABLET | Freq: Four times a day (QID) | ORAL | Status: DC | PRN

## 2024-05-04 MED ORDER — HALOPERIDOL LACTATE 5 MG/ML IJ SOLN: 5.0000 mg | Freq: Three times a day (TID) | INTRAMUSCULAR | Status: DC | PRN

## 2024-05-04 MED ORDER — ALUM & MAG HYDROXIDE-SIMETH 200-200-20 MG/5ML PO SUSP: 30.0000 mL | ORAL | Status: DC | PRN

## 2024-05-04 NOTE — ED Provider Notes (Signed)
 [facility] Continuous Assessment Admission H&P  Date: 05/04/24 Patient Name: Jacqueline Griffith MRN: [id number] Chief Complaint: Anxiety, Depression, SI  Diagnoses:  Final diagnoses:  MDD (major depressive disorder), recurrent severe, without psychosis (HCC)    HPI: Jacqueline Griffith is a 33 year old female who presented to [facility] as a voluntary, unaccompanied walk-in with complaints of uncontrolled anxiety and suicidal ideation.  [name] reports feeling extremely anxious today and acknowledges passive suicidal ideation without a plan or prior suicide attempts. She states she is currently taking medications to help manage her ongoing suicidal thoughts; however, her emotions remain intense. She denies having an active plan to end her life but endorses intrusive and intense suicidal thoughts.  She admits to daily marijuana use and describes episodic alcohol binges lasting anywhere from 2 days to 3 weeks. She denies HI and AVH but reports occasional dark thoughts about wishing harm on others, clarifying she has no desire or intent to act on these thoughts.  The patient carries psychiatric diagnoses of Major Depressive Disorder, Anxiety Disorder, Borderline Personality Disorder (BPD), ADHD, and PTSD. She states her emotional lability worsened after a recent argument with her boyfriend. The boyfriend has two children (ages 49 and 67) with visitation every other week, and they are planning to move in together. [name] expresses concern about her ability to manage her emotional instability in a household with children.  Following the argument, the patient began scratching herself on the back with her fingernails, which she identifies as a self-harming behavior. She states her thoughts then grew darker and more focused on wanting to hurt herself, and she reports not feeling safe to return home.  Throughout the assessment, [name] was tearful intermittently but cooperative. She currently takes Auvelity twice  daily, Spravato twice weekly, Focalin, and hydroxyzine for anxiety.  Total Time spent with patient: 45 minutes  Musculoskeletal  Strength & Muscle Tone: within normal limits Gait & Station: normal Patient leans: N/A  Psychiatric Specialty Exam  Presentation General Appearance:  Appropriate for Environment  Eye Contact: Good  Speech: Clear and Coherent  Speech Volume: Decreased  Handedness: Right   Mood and Affect  Mood: Depressed  Affect: Congruent; Tearful   Thought Process  Thought Processes: Coherent  Descriptions of Associations:Intact  Orientation:Full (Time, Place and Person)  Thought Content:Logical    Hallucinations:Hallucinations: None  Ideas of Reference:None  Suicidal Thoughts:Suicidal Thoughts: Yes, Passive SI Passive Intent and/or Plan: Without Plan; With Intent  Homicidal Thoughts:Homicidal Thoughts: No   Sensorium  Memory: Immediate Good  Judgment: Good  Insight: Good   Executive Functions  Concentration: Good  Attention Span: Good  Recall: Good  Fund of Knowledge: Good  Language: Good   Psychomotor Activity  Psychomotor Activity: Psychomotor Activity: Normal   Assets  Assets: Communication Skills; Desire for Improvement; Housing; Intimacy; Social Support   Sleep  Sleep: Sleep: Good Number of Hours of Sleep: 9   Nutritional Assessment (For OBS and FBC admissions only) Has the patient had a weight loss or gain of 10 pounds or more in the last 3 months?: No Has the patient had a decrease in food intake/or appetite?: Yes Does the patient have dental problems?: No Does the patient have eating habits or behaviors that may be indicators of an eating disorder including binging or inducing vomiting?: No Has the patient recently lost weight without trying?: 0 Has the patient been eating poorly because of a decreased appetite?: 1 Malnutrition Screening Tool Score: 1    Physical Exam HENT:  Nose:  Nose normal.     Mouth/Throat:     Pharynx: Oropharynx is clear.  Eyes:     Extraocular Movements: Extraocular movements intact.  Pulmonary:     Effort: Pulmonary effort is normal.  Musculoskeletal:        General: Normal range of motion.     Cervical back: Normal range of motion.  Skin:    General: Skin is dry.  Neurological:     Mental Status: She is alert.    Review of Systems  Constitutional: Negative.   HENT: Negative.    Eyes: Negative.   Respiratory: Negative.    Cardiovascular: Negative.   Gastrointestinal: Negative.   Genitourinary: Negative.   Musculoskeletal: Negative.   Skin: Negative.   Neurological: Negative.   Psychiatric/Behavioral:  Positive for depression and suicidal ideas.     There were no vitals taken for this visit. There is no height or weight on file to calculate BMI.  Past Psychiatric History: Depression, Anxiety< PTSD, BPD   Is the patient at risk to self? Yes  Has the patient been a risk to self in the past 6 months? Yes .    Has the patient been a risk to self within the distant past? Yes   Is the patient a risk to others? No   Has the patient been a risk to others in the past 6 months? No   Has the patient been a risk to others within the distant past? No   Past Medical History: None reported  Family History: None reported  Social History: Some college, uses marijuana daily  Last Labs:  Admission on 03/17/2024, Discharged on 03/17/2024  Component Date Value Ref Range Status   Lipase 03/17/2024 35  11 - 51 U/L Final   Performed at [facility], [address], [place], [place] [address]   Sodium 03/17/2024 141  135 - 145 mmol/L Final   Potassium 03/17/2024 3.7  3.5 - 5.1 mmol/L Final   Chloride 03/17/2024 99  98 - 111 mmol/L Final   CO2 03/17/2024 18 (L)  22 - 32 mmol/L Final   Glucose, Bld 03/17/2024 76  70 - 99 mg/dL Final   Glucose reference range applies only to samples taken after fasting for at least 8  hours.   BUN 03/17/2024 12  6 - 20 mg/dL Final   Creatinine, Ser 03/17/2024 0.82  0.44 - 1.00 mg/dL Final   Calcium 90/95/7974 9.4  8.9 - 10.3 mg/dL Final   Total Protein 90/95/7974 7.6  6.5 - 8.1 g/dL Final   Albumin 90/95/7974 4.9  3.5 - 5.0 g/dL Final   AST 90/95/7974 31  15 - 41 U/L Final   ALT 03/17/2024 21  0 - 44 U/L Final   Alkaline Phosphatase 03/17/2024 48  38 - 126 U/L Final   Total Bilirubin 03/17/2024 0.6  0.0 - 1.2 mg/dL Final   GFR, Estimated 03/17/2024 >60  >60 mL/min Final   Comment: (NOTE) Calculated using the CKD-EPI Creatinine Equation (2021)    Anion gap 03/17/2024 24 (H)  5 - 15 Final   Performed at [facility], [address], [place], [place] [address]   WBC 03/17/2024 16.0 (H)  4.0 - 10.5 K/uL Final   RBC 03/17/2024 4.71  3.87 - 5.11 MIL/uL Final   Hemoglobin 03/17/2024 14.3  12.0 - 15.0 g/dL Final   HCT 90/95/7974 42.8  36.0 - 46.0 % Final   MCV 03/17/2024 90.9  80.0 - 100.0 fL Final  MCH 03/17/2024 30.4  26.0 - 34.0 pg Final   MCHC 03/17/2024 33.4  30.0 - 36.0 g/dL Final   RDW 90/95/7974 12.6  11.5 - 15.5 % Final   Platelets 03/17/2024 289  150 - 400 K/uL Final   nRBC 03/17/2024 0.0  0.0 - 0.2 % Final   Performed at [facility], [address] [place], [place] [address]   Color, Urine 03/17/2024 YELLOW  YELLOW Final   APPearance 03/17/2024 HAZY (A)  CLEAR Final   Specific Gravity, Urine 03/17/2024 1.021  1.005 - 1.030 Final   pH 03/17/2024 5.0  5.0 - 8.0 Final   Glucose, UA 03/17/2024 NEGATIVE  NEGATIVE mg/dL Final   Hgb urine dipstick 03/17/2024 NEGATIVE  NEGATIVE Final   Bilirubin Urine 03/17/2024 NEGATIVE  NEGATIVE Final   Ketones, ur 03/17/2024 80 (A)  NEGATIVE mg/dL Final   Protein, ur 90/95/7974 30 (A)  NEGATIVE mg/dL Final   Nitrite 90/95/7974 NEGATIVE  NEGATIVE Final   Leukocytes,Ua 03/17/2024 NEGATIVE  NEGATIVE Final   RBC / HPF 03/17/2024 0-5  0 - 5 RBC/hpf Final   WBC, UA 03/17/2024 0-5  0 - 5  WBC/hpf Final   Bacteria, UA 03/17/2024 RARE (A)  NONE SEEN Final   Squamous Epithelial / HPF 03/17/2024 0-5  0 - 5 /HPF Final   Mucus 03/17/2024 PRESENT   Final   Hyaline Casts, UA 03/17/2024 PRESENT   Final   Performed at [facility], [address], [place], [place] [address]   Preg, Serum 03/17/2024 NEGATIVE  NEGATIVE Final   Comment:        THE SENSITIVITY OF THIS METHODOLOGY IS >10 mIU/mL. Performed at [facility], [address] [place], [place] [address]    Lactic Acid, Venous 03/17/2024 4.8 (HH)  0.5 - 1.9 mmol/L Final   Comment 03/17/2024 NOTIFIED PHYSICIAN   Final   Beta-Hydroxybutyric Acid 03/17/2024 1.61 (H)  0.05 - 0.27 mmol/L Final   Performed at [facility], [address], [place], [place] [address]   Lactic Acid, Venous 03/17/2024 3.3 (HH)  0.5 - 1.9 mmol/L Final   Comment 03/17/2024 NOTIFIED PHYSICIAN   Final   Sodium 03/17/2024 140  135 - 145 mmol/L Final   Potassium 03/17/2024 4.1  3.5 - 5.1 mmol/L Final   Chloride 03/17/2024 104  98 - 111 mmol/L Final   CO2 03/17/2024 18 (L)  22 - 32 mmol/L Final   Glucose, Bld 03/17/2024 62 (L)  70 - 99 mg/dL Final   Glucose reference range applies only to samples taken after fasting for at least 8 hours.   BUN 03/17/2024 10  6 - 20 mg/dL Final   Creatinine, Ser 03/17/2024 0.72  0.44 - 1.00 mg/dL Final   Calcium 90/95/7974 8.0 (L)  8.9 - 10.3 mg/dL Final   GFR, Estimated 03/17/2024 >60  >60 mL/min Final   Comment: (NOTE) Calculated using the CKD-EPI Creatinine Equation (2021)    Anion gap 03/17/2024 19 (H)  5 - 15 Final   Performed at [facility], [address], [place], [place] [address]   Glucose-Capillary 03/17/2024 67 (L)  70 - 99 mg/dL Final   Glucose reference range applies only to samples taken after fasting for at least 8 hours.   Lactic Acid, Venous 03/17/2024 1.1  0.5 - 1.9 mmol/L Final   Sodium 03/17/2024 137  135 - 145 mmol/L Final   Potassium  03/17/2024 4.0  3.5 - 5.1 mmol/L Final  Chloride 03/17/2024 102  98 - 111 mmol/L Final   CO2 03/17/2024 19 (L)  22 - 32 mmol/L Final   Glucose, Bld 03/17/2024 85  70 - 99 mg/dL Final   Glucose reference range applies only to samples taken after fasting for at least 8 hours.   BUN 03/17/2024 11  6 - 20 mg/dL Final   Creatinine, Ser 03/17/2024 0.73  0.44 - 1.00 mg/dL Final   Calcium 90/95/7974 8.2 (L)  8.9 - 10.3 mg/dL Final   GFR, Estimated 03/17/2024 >60  >60 mL/min Final   Comment: (NOTE) Calculated using the CKD-EPI Creatinine Equation (2021)    Anion gap 03/17/2024 16 (H)  5 - 15 Final   Performed at [facility], [address] [place], [place] [address]    Allergies: Patient has no known allergies.  Medications:  Facility Ordered Medications  Medication   acetaminophen  (TYLENOL ) tablet 650 mg   alum & mag hydroxide-simeth (MAALOX/MYLANTA) 200-200-20 MG/5ML suspension 30 mL   magnesium hydroxide (MILK OF MAGNESIA) suspension 30 mL   haloperidol (HALDOL) tablet 5 mg   And   diphenhydrAMINE (BENADRYL) capsule 50 mg   haloperidol lactate (HALDOL) injection 5 mg   And   diphenhydrAMINE (BENADRYL) injection 50 mg   And   LORazepam (ATIVAN) injection 2 mg   haloperidol lactate (HALDOL) injection 10 mg   And   diphenhydrAMINE (BENADRYL) injection 50 mg   And   LORazepam (ATIVAN) injection 2 mg   hydrOXYzine (ATARAX) tablet 25 mg   traZODone (DESYREL) tablet 50 mg   PTA Medications  Medication Sig   hydrOXYzine (ATARAX) 25 MG tablet Take 25 mg by mouth 3 (three) times daily.   mirtazapine (REMERON) 15 MG tablet Take 15 mg by mouth at bedtime.   traZODone (DESYREL) 50 MG tablet Take 50 mg by mouth at bedtime.   lisdexamfetamine (VYVANSE) 20 MG capsule Take 20 mg by mouth every morning.   desvenlafaxine (PRISTIQ) 100 MG 24 hr tablet Take 100 mg by mouth daily.   escitalopram (LEXAPRO) 20 MG tablet Take 20 mg by mouth daily.   ondansetron  (ZOFRAN -ODT) 4  MG disintegrating tablet Take 1 tablet (4 mg total) by mouth every 8 (eight) hours as needed for nausea or vomiting.   levonorgestrel (KYLEENA) 19.5 MG IUD 1 each by Intrauterine route once.   propranolol (INDERAL) 20 MG tablet Take 20 mg by mouth 3 (three) times daily.      Medical Decision Making  Patient poses a risk of imminent danger to self.     Patient recommended for inpatient treatment for crisis stabilization, mood stabilization and medication management.     Recommendations  Based on my evaluation the patient does not appear to have an emergency medical condition.  Ardella Chhim, NP 05/04/24  3:11 PM

## 2024-05-04 NOTE — ED Notes (Signed)
 Patient presents with a depressed mood, tearful affect, and reports experiencing passive suicidal thoughts with intent but denies having a specific plan.pt states no si at this time.  Denies homicidal ideations, hallucinations, or delusional thoughts. Patient verbalizes feelings of sadness and hopelessness but remains cooperative and engaged. Patient appropriately dressed for environment with good eye contact. Speech clear and coherent but decreased in volume. Mood depressed; affect congruent and tearful. Thought process coherent and logical with intact associations. Fully oriented to person, place, and time. Judgment, insight, concentration, attention, recall, and fund of knowledge all assessed as good. Psychomotor activity within normal limits. No hallucinations or delusions observed. Pt oriented to unit, environment secured, safety measures in place, poc ongoing

## 2024-05-04 NOTE — Progress Notes (Signed)
 Pt was accepted to [facility] on 05/04/2024 . Bed assignment: 301   Pt meets inpatient criteria per Angela Maclauchlin NP   Attending Physician will be: Dr.Jadepalle    Report can be called to: [phone]  Pt can arrive pending labs   Care Team Notified:  [facility] Cherylynn Ernst, RN, Comfort Reinhold, RN, Angela Maclauchlin NP
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
## 2024-05-04 NOTE — ED Notes (Signed)
 Pt A&O x 4, awake & resting at present, no distress noted.  Monitoring for safety.  Pending report & transfer to [facility].

## 2024-05-04 NOTE — ED Notes (Signed)
 Report attempted to unit, instructed to call back in 10-15 min.

## 2024-05-04 NOTE — BH Assessment (Signed)
 Comprehensive Clinical Assessment (CCA) Note  05/04/2024 [name] [id number]  DISPOSITION: Per Angela Maclauchlin NP pt is recommended for inpatient psychiatric admission  The patient demonstrates the following risk factors for suicide: Chronic risk factors for suicide include: psychiatric disorder of BPD, MDD, ADHD and substance use disorder. Acute risk factors for suicide include: family or marital conflict, unemployment, and social withdrawal/isolation. Protective factors for this patient include: positive therapeutic relationship, responsibility to others (children, family), and hope for the future. Considering these factors, the overall suicide risk at this point appears to be high. Patient is appropriate for outpatient follow up.   Per Triage assessment: "[name] is a 33 year old female presenting to [facility] unaccompanied. Pt states that she is feeling extremely anxious today. Pt states she has passive SI, but no past attempts. Pt states she is taking medication to help with her ongoing suicidal thoughts, however her emotions are still intense. Pt denies a plan to end her life, but endorses intense thoughts. Pt dneies substance use, Hi and AVH. PT is diagnosed wiht depression, anxiety, BPD and PTSD. Pts appearance is neat, affect is full, motor activity is normal, eye contract avoidant, depressed mood."  With further assessment: Pt is a 33 yo female who presented today voluntarily and unaccompanied. Pt stated that she was having "some intense feelings" that she could not get under control. Pt denied active SI but stated that she was "wanting to run away" or "go to sleep and not wake up." Pt denied any suicide attempts in the past but stated she did not feel safe at home. Pt stated that she had an emotional conversation with her partner about he and his 2 children who live with her now moving out to somewhere else to live. Pt stated that while she no longer things she can take the pressure  of their living with her she believes that she is a big part of the children's lives sine they are with her and their father 50% of the time. Hx of BPD, MDD and ADHD. Pt stated that she has some PTSD symptoms as a result of isolating during COVID. Pt denied HI, current self harm and paranoia. Pt reported daily use of cannabis gummies and monthly binges on alcohol in which she sometimes "blacks out" mixing alcohol with her prescribed medications.   Pt stated that she currently lives with her partner and his 2 children. Pt stated that she has never married and has no children of her own. Pt stated they have "been together" for about 4 years. Pt has OP providers for medication management and OP therapy. Pt sees Dr. Vincente for medication management and [facility] once a week and another OP therapist periodically (Dawn Aberso.) Pt stated that she is currently unemployed and not receiving disability income. Pt stated that she has been psychiatrically hospitalized once in 2022 for [place] of state. Pt has also recently attended [facility] PHP and IOP programs. Pt stated she does not feel hopeless or helpless but is afraid to be by herself. Afraid she might hurt herself. Pt stated that she is sleeping about 9 hours a night with aide of ZZquil or Melatonin but stated he appetite "is all over the place."   Chief Complaint:  Chief Complaint  Patient presents with   Anxiety   Depression   Visit Diagnosis:  MDD, Recurrent, Severe Cannabis Use d/o    CCA Screening, Triage and Referral (STR)  Patient Reported Information How did you hear about us ? Self  What Is the Reason for Your Visit/Call Today? [name] is a 33 year old female presenting to [facility] unaccompanied. Pt states that she is feeling extremely anxious today. Pt states she has passive SI, but no past attempts. Pt states she is taking medication to help with her ongoing suicidal thoughts, however her emotions are still intense. Pt denies a plan to  end her life, but endorses intense thoughts. Pt dneies substance use, Hi and AVH. PT is diagnosed wiht depression, anxiety, BPD and PTSD. Pts appearance is neat, affect is full, motor activity is normal, eye contract avoidant, depressed mood.  How Long Has This Been Causing You Problems? <Week  What Do You Feel Would Help You the Most Today? Stress Management; Treatment for Depression or other mood problem   Have You Recently Had Any Thoughts About Hurting Yourself? Yes  Are You Planning to Commit Suicide/Harm Yourself At This time? No   Flowsheet Row ED from 03/17/2024 in [facility] Emergency Department at [facility]  C-SSRS RISK CATEGORY No Risk    Have you Recently Had Thoughts About Hurting Someone Sherral? No  Are You Planning to Harm Someone at This Time? No  Explanation: na  Have You Used Any Alcohol or Drugs in the Past 24 Hours? No  How Long Ago Did You Use Drugs or Alcohol? na What Did You Use and How Much? na  Do You Currently Have a Therapist/Psychiatrist? Yes  Name of Therapist/Psychiatrist: Name of Therapist/Psychiatrist: Dr. Vincente for medication management;  [facility] and Dawn Aberso for OP therapy   Have You Been Recently Discharged From Any Office Practice or Programs? Yes  Explanation of Discharge From Practice/Program: [facility] PHP and IOP     CCA Screening Triage Referral Assessment Type of Contact: Face-to-Face  Telemedicine Service Delivery:   Is this Initial or Reassessment?   Date Telepsych consult ordered in [facility]:    Time Telepsych consult ordered in [facility]:    Location of Assessment: [facility] Assessment Services  Provider Location: [facility] Assessment Services   Collateral Involvement: none   Does Patient Have a Automotive engineer Guardian? No  Legal Guardian Contact Information: na  Copy of Legal Guardianship Form: -- (na)  Legal Guardian Notified of Arrival: -- (na)  Legal Guardian Notified of Pending Discharge: --  (na)  If Minor and Not Living with Parent(s), Who has Custody? adult  Is CPS involved or ever been involved? -- (none reported)  Is APS involved or ever been involved? -- (none reported)   Patient Determined To Be At Risk for Harm To Self or Others Based on Review of Patient Reported Information or Presenting Complaint? Yes, for Self-Harm  Method: No Plan  Availability of Means: Has close by  Intent: Vague intent or NA  Notification Required: No need or identified person  Additional Information for Danger to Others Potential: -- (na)  Additional Comments for Danger to Others Potential: none  Are There Guns or Other Weapons in Your Home? -- (na)  Types of Guns/Weapons: na  Are These Weapons Safely Secured?                            -- (na)  Who Could Verify You Are Able To Have These Secured: na  Do You Have any Outstanding Charges, Pending Court Dates, Parole/Probation? pt denied  Contacted To Inform of Risk of Harm To Self or Others: -- (na)    Does Patient Present  under Involuntary Commitment? No    [place] of Residence: [place]   Patient Currently Receiving the Following Services: Individual Therapy; Medication Management   Determination of Need: Emergent (2 hours) (Per Angela Maclauchlin NP pt is recommended for inpatient psychiatric admission)   Options For Referral: Inpatient Hospitalization     CCA Biopsychosocial Patient Reported Schizophrenia/Schizoaffective Diagnosis in Past: No   Strengths: able to ask for and accept help   Mental Health Symptoms Depression:  Difficulty Concentrating; Irritability; Increase/decrease in appetite; Tearfulness   Duration of Depressive symptoms: Duration of Depressive Symptoms: Greater than two weeks   Mania:  Racing thoughts   Anxiety:   Worrying   Psychosis:  None   Duration of Psychotic symptoms:    Trauma:  None   Obsessions:  None   Compulsions:  None   Inattention:  N/A    Hyperactivity/Impulsivity:  N/A   Oppositional/Defiant Behaviors:  N/A   Emotional Irregularity:  Potentially harmful impulsivity; Mood lability; Intense/unstable relationships; Intense/inappropriate anger; Frantic efforts to avoid abandonment; Recurrent suicidal behaviors/gestures/threats   Other Mood/Personality Symptoms:  none    Mental Status Exam Appearance and self-care  Stature:  Average   Weight:  Average weight   Clothing:  Casual   Grooming:  Normal   Cosmetic use:  None   Posture/gait:  Normal   Motor activity:  Not Remarkable   Sensorium  Attention:  Distractible   Concentration:  Scattered   Orientation:  X5   Recall/memory:  Normal   Affect and Mood  Affect:  Tearful; Depressed   Mood:  Depressed; Dysphoric   Relating  Eye contact:  Normal   Facial expression:  Depressed   Attitude toward examiner:  Cooperative   Thought and Language  Speech flow: Other (Comment) ([place])   Thought content:  Appropriate to Mood and Circumstances   Preoccupation:  Ruminations (disagreement with partner)   Hallucinations:  None   Organization:  Circumstantial   Company secretary of Knowledge:  Average   Intelligence:  Average   Abstraction:  Functional   Judgement:  Fair   Dance movement psychotherapist:  Adequate   Insight:  Lacking; Gaps; Flashes of insight   Decision Making:  Impulsive; Vacilates   Social Functioning  Social Maturity:  Isolates   Social Judgement:  Normal   Stress  Stressors:  Family conflict; Housing; Surveyor, quantity; Work   Coping Ability:  Exhausted; Overwhelmed   Skill Deficits:  Communication; Decision making; Interpersonal; Self-care; Self-control   Supports:  Friends/Service system     Religion: Religion/Spirituality Are You A Religious Person?: No How Might This Affect Treatment?: none  Leisure/Recreation: Leisure / Recreation Do You Have Hobbies?: No  Exercise/Diet: Exercise/Diet Do You Exercise?:  Yes What Type of Exercise Do You Do?: Run/Walk How Many Times a Week Do You Exercise?: 6-7 times a week Have You Gained or Lost A Significant Amount of Weight in the Past Six Months?: No Do You Follow a Special Diet?: No Do You Have Any Trouble Sleeping?: No   CCA Employment/Education Employment/Work Situation: Employment / Work Situation Employment Situation: Unemployed Patient's Job has Been Impacted by Current Illness:  (na) Has Patient ever Been in the [employer]?: No  Education: Education Is Patient Currently Attending School?: No Last Grade Completed: 14 Did You Product manager?: Yes What Type of College Degree Do you Have?: no degree completed Did You Have An Individualized Education Program (IIEP): No Did You Have Any Difficulty At School?: Yes Were Any Medications Ever Prescribed For These Difficulties?: Yes Medications  Prescribed For School Difficulties?: ADHD Patient's Education Has Been Impacted by Current Illness: No   CCA Family/Childhood History Family and Relationship History: Family history Marital status: Long term relationship Long term relationship, how long?: 4 years What types of issues is patient dealing with in the relationship?: housing and partner's 2 children that they ahve 50% of the time Additional relationship information: na Does patient have children?: No  Childhood History:  Childhood History By whom was/is the patient raised?: Both parents Did patient suffer any verbal/emotional/physical/sexual abuse as a child?: No Did patient suffer from severe childhood neglect?: No Has patient ever been sexually abused/assaulted/raped as an adolescent or adult?: No Was the patient ever a victim of a crime or a disaster?: No Witnessed domestic violence?: No Has patient been affected by domestic violence as an adult?: No       CCA Substance Use Alcohol/Drug Use: Alcohol / Drug Use Pain Medications: see MAR Prescriptions: see MAR Over the  Counter: see MAR History of alcohol / drug use?: Yes Longest period of sobriety (when/how long): unknown Negative Consequences of Use: Personal relationships Withdrawal Symptoms: None (none reported) Substance #1 Name of Substance 1: cannabis 1 - Age of First Use: 15 1 - Amount (size/oz): unknown amount of gummies 1 - Frequency: daily 1 - Duration: ongoing 1 - Last Use / Amount: today 1 - Method of Aquiring: purchase 1- Route of Use: oral Substance #2 Name of Substance 2: alcohol 2 - Age of First Use: 15 2 - Amount (size/oz): binges of unknown amount 2 - Frequency: monthly, usually socially 2 - Duration: ongoing 2 - Last Use / Amount: 1 month ago 2 - Method of Aquiring: purchase 2 - Route of Substance Use: oral                     ASAM's:  Six Dimensions of Multidimensional Assessment  Dimension 1:  Acute Intoxication and/or Withdrawal Potential:   Dimension 1:  Description of individual's past and current experiences of substance use and withdrawal: none reported  Dimension 2:  Biomedical Conditions and Complications:   Dimension 2:  Description of patient's biomedical conditions and  complications: none reported  Dimension 3:  Emotional, Behavioral, or Cognitive Conditions and Complications:  Dimension 3:  Description of emotional, behavioral, or cognitive conditions and complications: Hx of BPD, MDD, ADHD  Dimension 4:  Readiness to Change:     Dimension 5:  Relapse, Continued use, or Continued Problem Potential:     Dimension 6:  Recovery/Living Environment:     ASAM Severity Score: ASAM's Severity Rating Score: 8  ASAM Recommended Level of Treatment: ASAM Recommended Level of Treatment: Level I Outpatient Treatment   Substance use Disorder (SUD) Substance Use Disorder (SUD)  Checklist Symptoms of Substance Use: Continued use despite having a persistent/recurrent physical/psychological problem caused/exacerbated by use, Continued use despite persistent or  recurrent social, interpersonal problems, caused or exacerbated by use, Recurrent use that results in a failure to fulfill major role obligations (work, school, home)  Recommendations for Services/Supports/Treatments: Recommendations for Services/Supports/Treatments Recommendations For Services/Supports/Treatments: Individual Therapy  Disposition Recommendation per psychiatric provider: We recommend inpatient psychiatric hospitalization when medically cleared. Patient is under voluntary admission status at this time; please IVC if attempts to leave hospital. Per Angela Maclauchlin NP pt is recommended for inpatient psychiatric admission   DSM5 Diagnoses: Patient Active Problem List   Diagnosis Date Noted   Abnormal finding on CT scan 10/12/2012   Stomach problems 10/12/2012   Frequent bowel  movements 10/12/2012   Nausea and vomiting in adult 08/26/2011   Sore throat 08/26/2011   ADJUSTMENT DISORDER WITH MIXED FEATURES 12/07/2009   SYNCOPE AND COLLAPSE 09/28/2009   MALAISE AND FATIGUE 08/08/2009   FOOT PAIN, BILATERAL 08/21/2008   THROAT PAIN 05/22/2008   TELOGEN EFFLUVIUM 08/20/2007   Abnormalities of hair 08/20/2007     Referrals to Alternative Service(s): Referred to Alternative Service(s):   Place:   Date:   Time:    Referred to Alternative Service(s):   Place:   Date:   Time:    Referred to Alternative Service(s):   Place:   Date:   Time:    Referred to Alternative Service(s):   Place:   Date:   Time:     Jacqueline Griffith T, Counselor

## 2024-05-04 NOTE — ED Notes (Signed)
 Report given to RN Drue Cory, [facility] BMU.  Pending transfer via [employer].

## 2024-05-04 NOTE — ED Notes (Signed)
 Report attempted a 2nd time. Unsuccessful.  [facility] BMU to call [facility] to give report when able.

## 2024-05-04 NOTE — ED Notes (Signed)
 Safe Transport requested.

## 2024-05-05 ENCOUNTER — Encounter: Payer: Self-pay | Admitting: Psychiatry

## 2024-05-05 ENCOUNTER — Other Ambulatory Visit: Payer: Self-pay

## 2024-05-05 DIAGNOSIS — F332 Major depressive disorder, recurrent severe without psychotic features: Principal | ICD-10-CM

## 2024-05-05 MED ORDER — DEXMETHYLPHENIDATE HCL ER 5 MG PO CP24
20.0000 mg | ORAL_CAPSULE | Freq: Every day | ORAL | Status: DC
  Administered 2024-05-09 – 2024-05-11 (×3): 20 mg via ORAL
  Filled 2024-05-05 (×6): qty 4

## 2024-05-05 MED ORDER — ALUM & MAG HYDROXIDE-SIMETH 200-200-20 MG/5ML PO SUSP
30.0000 mL | ORAL | Status: DC | PRN
  Administered 2024-05-10 – 2024-05-11 (×3): 30 mL via ORAL
  Filled 2024-05-05 (×3): qty 30

## 2024-05-05 MED ORDER — HYDROXYZINE HCL 25 MG PO TABS
25.0000 mg | ORAL_TABLET | Freq: Three times a day (TID) | ORAL | Status: DC | PRN
  Administered 2024-05-05 – 2024-05-07 (×9): 25 mg via ORAL
  Filled 2024-05-05 (×10): qty 1

## 2024-05-05 MED ORDER — DEXTROMETHORPHAN-BUPROPION ER 45-105 MG PO TBCR
1.0000 | EXTENDED_RELEASE_TABLET | Freq: Two times a day (BID) | ORAL | Status: DC
  Administered 2024-05-05 – 2024-05-11 (×12): 1 via ORAL
  Filled 2024-05-05 (×13): qty 1

## 2024-05-05 MED ORDER — ACETAMINOPHEN 325 MG PO TABS: 650.0000 mg | ORAL_TABLET | Freq: Four times a day (QID) | ORAL | Status: DC | PRN

## 2024-05-05 NOTE — Plan of Care (Signed)
   Problem: Education: Goal: Knowledge of Jacqueline Griffith General Education information/materials will improve Outcome: Progressing Goal: Emotional status will improve Outcome: Progressing Goal: Mental status will improve Outcome: Progressing Goal: Verbalization of understanding the information provided will improve Outcome: Progressing   Problem: Activity: Goal: Interest or engagement in activities will improve Outcome: Progressing Goal: Sleeping patterns will improve Outcome: Progressing   Problem: Coping: Goal: Ability to verbalize frustrations and anger appropriately will improve Outcome: Progressing Goal: Ability to demonstrate self-control will improve Outcome: Progressing

## 2024-05-05 NOTE — BHH Suicide Risk Assessment (Signed)
 [facility] Admission Suicide Risk Assessment   Nursing information obtained from:  Patient Demographic factors:  Unemployed Current Mental Status:  Suicidal ideation indicated by patient Loss Factors:  Financial problems / change in socioeconomic status Historical Factors:  Impulsivity Risk Reduction Factors:  Sense of responsibility to family  Total Time spent with patient: 30 minutes Principal Problem: Major depressive disorder, recurrent, severe without psychotic features (HCC) Diagnosis:  Principal Problem:   Major depressive disorder, recurrent, severe without psychotic features (HCC)  Subjective Data: This is a 33 year old female with history of MDD, GAD, borderline personality disorder, ADHD, PTSD, who presented to [facility] with suicidal ideation and self-harm.  Presenting issues include worsening depression and anxiety following recent familial stressors.  Current psychiatric medications include Auvelity, hydroxyzine, and Focalin.  She also has been receiving Spravato.  Family history is significant for depression and ADHD.  Patient is admitted to the adult inpatient unit with every 15-minute safety monitoring.  Multidisciplinary team approach is offered.  Medication management, group/milieu therapy is offered.  Continued Clinical Symptoms:  Alcohol Use Disorder Identification Test Final Score (AUDIT): 14 The Alcohol Use Disorders Identification Test, Guidelines for Use in Primary Care, Second Edition.  World Science writer [facility]). Score between 0-7:  no or low risk or alcohol related problems. Score between 8-15:  moderate risk of alcohol related problems. Score between 16-19:  high risk of alcohol related problems. Score 20 or above:  warrants further diagnostic evaluation for alcohol dependence and treatment.   CLINICAL FACTORS:   Depression:   Anhedonia   Musculoskeletal: Strength & Muscle Tone: within normal limits Gait & Station: normal Patient leans: N/A  Psychiatric  Specialty Exam:  Presentation  General Appearance:  Disheveled  Eye Contact: Fair  Speech: Clear and Coherent  Speech Volume: Normal  Handedness: Right   Mood and Affect  Mood: Depressed; Anxious  Affect: Labile   Thought Process  Thought Processes: Coherent  Descriptions of Associations:Circumstantial  Orientation:Full (Time, Place and Person)  Thought Content:WDL  History of Schizophrenia/Schizoaffective disorder:No  Duration of Psychotic Symptoms:No data recorded Hallucinations:Hallucinations: None  Ideas of Reference:None  Suicidal Thoughts:Suicidal Thoughts: Yes, Passive SI Passive Intent and/or Plan: Without Intent; Without Plan  Homicidal Thoughts:Homicidal Thoughts: No   Sensorium  Memory: Immediate Fair; Recent Fair; Remote Fair  Judgment: Poor  Insight: Fair   Chartered certified accountant: Poor  Attention Span: Fair  Recall: [employer] of Knowledge: Fair  Language: Fair   Psychomotor Activity  Psychomotor Activity: Psychomotor Activity: Normal   Assets  Assets: Manufacturing systems engineer; Desire for Improvement; Housing; Social Support   Sleep  Sleep: Sleep: Fair Number of Hours of Sleep: 9    Physical Exam: Physical Exam ROS Blood pressure 116/87, pulse 66, temperature 98.1 F (36.7 C), temperature source Oral, resp. rate 16, height 5' 5 (1.651 m), weight 64 kg, SpO2 100%. Body mass index is 23.46 kg/m.   COGNITIVE FEATURES THAT CONTRIBUTE TO RISK:  None    SUICIDE RISK:   Moderate:  Frequent suicidal ideation with limited intensity, and duration, some specificity in terms of plans, no associated intent, good self-control, limited dysphoria/symptomatology, some risk factors present, and identifiable protective factors, including available and accessible social support.  PLAN OF CARE: Patient is admitted to the adult inpatient unit with every 15-minute safety monitoring.  Multidisciplinary team  approach is offered.  Medication management, group/milieu therapy is offered.  I certify that inpatient services furnished can reasonably be expected to improve the patient's condition.   Eliyah Mcshea LITTIE Lukes,  PA-C 05/05/2024, 7:46 PM

## 2024-05-05 NOTE — Progress Notes (Signed)
 Pt crying during verbal assessment in medication room.  Refused Focalin bc she had it in the evening yesterday.  One of her meds is from her own personal prescription which is located in the pharmacy when you need it.

## 2024-05-05 NOTE — BHH Suicide Risk Assessment (Signed)
 BHH INPATIENT:  Family/Significant Other Suicide Prevention Education  Suicide Prevention Education:  Education Completed;  Jacqueline Griffith, [phone], Partner, has been identified by the patient as the family member/significant other with whom the patient will be residing, and identified as the person(s) who will aid the patient in the event of a mental health crisis (suicidal ideations/suicide attempt).  With written consent from the patient, the family member/significant other has been provided the following suicide prevention education, prior to the and/or following the discharge of the patient.  The suicide prevention education provided includes the following: Suicide risk factors Suicide prevention and interventions National Suicide Hotline telephone number [facility] assessment telephone number [facility] Emergency Assistance 911 [facility] and/or Residential Mobile Crisis Unit telephone number  Request made of family/significant other to: Remove weapons (e.g., guns, rifles, knives), all items previously/currently identified as safety concern.   Remove drugs/medications (over-the-counter, prescriptions, illicit drugs), all items previously/currently identified as a safety concern.  The family member/significant other verbalizes understanding of the suicide prevention education information provided.  The family member/significant other agrees to remove the items of safety concern listed above.  According to patient's boyfriend, the patient has been "frequent to different places for her mental health." Boyfriend reported that the patient recently completed "PHP with [facility]" and that while she enjoyed it, there was not enough follow up. Boyfriend reported that the patient usually comes to inpatient facilities for "a safe place if she's unstable." Boyfriend reported that he's been trying to be a support for the patient but that it' "can be tough on me too."  Boyfriend reported that the patient looks for "stability and safety." Boyfriend reported that the patient experience many "ups and downs" and with them "sharing a small space" they have been dealing with tensions. Boyfriend reported that he has been working to 'find another place to stay" to give more space to them both. Boyfriend reported that the patient has no access to weapons. Boyfriend reported no present safety concerns. Boyfriend reported that he believes the patient needs "time to herself" but can return home.   Jacqueline Griffith 05/05/2024, 11:32 AM

## 2024-05-05 NOTE — Group Note (Signed)
 Date:  05/05/2024 Time:  11:17 PM  Group Topic/Focus:  Coping With Mental Health Crisis:   The purpose of this group is to help patients identify strategies for coping with mental health crisis.  Group discusses possible causes of crisis and ways to manage them effectively.    Participation Level:  Active  Participation Quality:  Appropriate  Affect:  Appropriate  Cognitive:  Appropriate  Insight: Appropriate  Engagement in Group:  Engaged  Modes of Intervention:  Discussion  Additional Comments:    Luciano Emilio CROME 05/05/2024, 11:17 PM
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
## 2024-05-05 NOTE — Plan of Care (Signed)

## 2024-05-05 NOTE — Group Note (Signed)
 [facility] LCSW Group Therapy Note   Group Date: 05/05/2024 Start Time: 1300 End Time: 1345   Type of Therapy/Topic:  Group Therapy:  Balance in Life  Participation Level:  Did Not Attend   Description of Group:    This group will address the concept of balance and how it feels and looks when one is unbalanced. Patients will be encouraged to process areas in their lives that are out of balance, and identify reasons for remaining unbalanced. Facilitators will guide patients utilizing problem- solving interventions to address and correct the stressor making their life unbalanced. Understanding and applying boundaries will be explored and addressed for obtaining  and maintaining a balanced life. Patients will be encouraged to explore ways to assertively make their unbalanced needs known to significant others in their lives, using other group members and facilitator for support and feedback.  Therapeutic Goals: Patient will identify two or more emotions or situations they have that consume much of in their lives. Patient will identify signs/triggers that life has become out of balance:  Patient will identify two ways to set boundaries in order to achieve balance in their lives:  Patient will demonstrate ability to communicate their needs through discussion and/or role plays  Summary of Patient Progress: Patient did not attend group.    Therapeutic Modalities:   Cognitive Behavioral Therapy Solution-Focused Therapy Assertiveness Training   Nadara JONELLE Fam, LCSW

## 2024-05-05 NOTE — Plan of Care (Signed)
   Problem: Safety: Goal: Periods of time without injury will increase Outcome: Progressing

## 2024-05-05 NOTE — Group Note (Signed)
 Recreation Therapy Group Note   Group Topic:General Recreation  Group Date: 05/05/2024 Start Time: 1520 End Time: 1620 Facilitators: Celestia Jeoffrey BRAVO, LRT, CTRS Location: Courtyard  Group Description: [employer]. LRT and patients played games of basketball, drew with chalk, and played corn hole while outside in the courtyard while getting fresh air and sunlight. Music was being played in the background. LRT and peers conversed about different games they have played before, what they do in their free time and anything else that is on their minds. LRT encouraged pts to drink water after being outside, sweating and getting their heart rate up.  Goal Area(s) Addressed: Patient will build on frustration tolerance skills. Patients will partake in a competitive play game with peers. Patients will gain knowledge of new leisure interest/hobby.   Affect/Mood: Appropriate   Participation Level: Minimal    Clinical Observations/Individualized Feedback: Jacqueline Griffith came late to group and left early. Pt was present for 10 minutes.   Plan: Continue to engage patient in RT group sessions 2-3x/week.   Jeoffrey BRAVO Celestia, LRT, CTRS 05/05/2024 5:28 PM

## 2024-05-05 NOTE — Group Note (Signed)
 Recreation Therapy Group Note   Group Topic:Coping Skills  Group Date: 05/05/2024 Start Time: 1000 End Time: 1055 Facilitators: Celestia Jeoffrey BRAVO, LRT, CTRS Location: Craft Room  Group Description: Mind Map.  Patient was provided a blank template of a diagram with 32 blank boxes in a tiered system, branching from the center (similar to a bubble chart). LRT directed patients to label the middle of the diagram Coping Skills. LRT and patients then came up with 8 different coping skills as examples. Pt were directed to record their coping skills in the 2nd tier boxes closest to the center.  Patients would then share their coping skills with the group as LRT wrote them out. LRT gave a handout of 99 different coping skills at the end of group.   Goal Area(s) Addressed: Patients will be able to define "coping skills". Patient will identify new coping skills.  Patient will increase communication.  Affect/Mood: Appropriate   Participation Level: Active and Engaged   Participation Quality: Independent   Behavior: Calm and Cooperative   Speech/Thought Process: Coherent   Insight: Good   Judgement: Good   Modes of Intervention: Clarification, Education, Worksheet, and Writing   Patient Response to Interventions:  Attentive, Engaged, Interested , and Receptive   Education Outcome:  Acknowledges education   Clinical Observations/Individualized Feedback: Jacqueline Griffith was active in their participation of session activities and group discussion. Pt identified building something and planning a trip as coping skills.    Plan: Continue to engage patient in RT group sessions 2-3x/week.   Jeoffrey BRAVO Celestia, LRT, CTRS 05/05/2024 11:32 AM

## 2024-05-05 NOTE — Progress Notes (Signed)
   05/05/24 2100  Psych Admission Type (Psych Patients Only)  Admission Status Voluntary  Psychosocial Assessment  Patient Complaints Anxiety  Eye Contact Fair  Facial Expression Anxious  Affect Sad  Speech Soft  Interaction Minimal  Motor Activity Slow  Appearance/Hygiene Unremarkable  Behavior Characteristics Cooperative  Mood Depressed  Thought Process  Coherency WDL  Content WDL  Delusions None reported or observed  Perception WDL  Hallucination None reported or observed  Judgment Impaired  Confusion None  Danger to Self  Current suicidal ideation? Denies  Self-Injurious Behavior No self-injurious ideation or behavior indicators observed or expressed   Agreement Not to Harm Self Yes  Description of Agreement verbal  Danger to Others  Danger to Others None reported or observed

## 2024-05-05 NOTE — Progress Notes (Signed)
   05/05/24 0810  Psych Admission Type (Psych Patients Only)  Admission Status Voluntary  Psychosocial Assessment  Patient Complaints Anxiety;Crying spells;Hopelessness;Sadness  Eye Contact Fair  Facial Expression Anxious  Affect Sad  Speech Soft  Interaction Minimal  Motor Activity Slow  Appearance/Hygiene Unremarkable;In scrubs  Behavior Characteristics Cooperative  Mood Depressed;Anxious;Sad  Aggressive Behavior  Targets Self  Type of Behavior Other (Comment) (not observed by this RN up until this point)  Effect Self-harm  Thought Process  Coherency WDL  Content WDL  Delusions None reported or observed  Perception WDL  Hallucination None reported or observed  Judgment Impaired  Confusion None  Danger to Self  Current suicidal ideation? Passive  Description of Suicide Plan Denies  Self-Injurious Behavior Some self-injurious ideation observed or expressed.  No lethal plan expressed   Agreement Not to Harm Self Yes  Description of Agreement Verbal  Danger to Others  Danger to Others None reported or observed
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
## 2024-05-05 NOTE — H&P (Signed)
 Psychiatric Admission Assessment Adult  Patient Identification: Jacqueline Griffith MRN:  [id number] Date of Evaluation:  05/05/2024 Chief Complaint:  MDD (major depressive disorder), recurrent episode, severe (HCC) [F33.2]   History of Present Illness:  Jacqueline Griffith is a 33 year old female who presented to [facility] as a voluntary, unaccompanied walk-in with complaints of uncontrolled anxiety and suicidal ideation. Jacqueline Griffith reports feeling extremely anxious today and acknowledges passive suicidal ideation without a plan or prior suicide attempts. She states she is currently taking medications to help manage her ongoing suicidal thoughts; however, her emotions remain intense. She denies having an active plan to end her life but endorses intrusive and intense suicidal thoughts.  She admits to daily marijuana use and describes episodic alcohol binges lasting anywhere from 2 days to 3 weeks. She denies HI and AVH but reports occasional dark thoughts about wishing harm on others, clarifying she has no desire or intent to act on these thoughts.  The patient carries psychiatric diagnoses of Major Depressive Disorder, Anxiety Disorder, Borderline Personality Disorder (BPD), ADHD, and PTSD. She states her emotional lability worsened after a recent argument with her boyfriend. The boyfriend has two children (ages 68 and 48) with visitation every other week, whom patient currently lives with in a small home. Following the argument, the patient began scratching herself on the back with her fingernails, which she identifies as a self-harming behavior. She states her thoughts then grew darker and more focused on wanting to hurt herself, and she reports not feeling safe to return home.  Patient sees Dr. Vincente for outpatient psychiatric management and currently takes Auvelity twice daily, Spravato twice weekly, Focalin, and hydroxyzine for anxiety.  She feels her current medication regimen has been effective overall.  On  interview today, patient endorses ongoing depressive symptoms and anxiety.  She endorses depressed mood, anhedonia, feelings of guilt, and hypersomnia.  She endorses anxiety including chronic worries.  She endorses some paranoia, and hypervigilance related to past trauma.  She previously participated in a PHP program over the summer.  She denies current SI/HI/plan and denies hallucinations.  She denies current thoughts of self-harm.  She denies past suicide attempt. She is able to contract for safety.  Patient gives consent for her partner, Jacqueline Griffith, to be involved in her care plan, his phone number is [phone].  She also request that her parents, Jacqueline Griffith and Jacqueline Griffith be involved in her care plan, Jacqueline Griffith's phone number is [phone] and Jacqueline Griffith's is [phone].  Total Time spent with patient: 1 hour Sleep  Sleep:Sleep: Fair Number of Hours of Sleep: 9  Past Psychiatric History: MDD, anxiety, ADHD, borderline personality disorder, substance use disorder Psychiatric History:  Information collected from the patient and chart review  Prev Dx/Sx: Depression, anxiety Current Psych Provider: Dr. Vincente Home Meds (current): Auvelity twice daily, Focalin, hydroxyzine; also receives Spravato twice weekly at outpatient clinic Previous Med Trials: Lexapro 20 mg, Pristiq 100 mg, Remeron 15 mg, propranolol 20 mg 3 times daily, Vyvanse Therapy: Yes, [facility]  Prior Psych Hospitalization: Once in 2022 for SI Prior Self Harm: Yes, scratches skin Prior Violence: Denies  Family Psych History: Depression, ADHD Family Hx suicide: Denies  Social History:  Educational Hx: Some college Occupational Hx: Currently unemployed, not receiving disability Legal Hx: Denies Living Situation: Lives with partner and partner's 2 children Spiritual Hx: Unknown Access to weapons/lethal means: Denies  Substance History Alcohol: Occasional binge drinking Type of alcohol varies Last Drink approximately 5 weeks  ago Number of drinks per day varies, not  daily drinker History of alcohol withdrawal seizures denies History of DT's denies Tobacco: Denies Illicit drugs: Marijuana Prescription drug abuse: Denies Rehab hx: Denies Is the patient at risk to self? Yes.    Has the patient been a risk to self in the past 6 months? Yes.    Has the patient been a risk to self within the distant past? Yes.    Is the patient a risk to others? No.  Has the patient been a risk to others in the past 6 months? No.  Has the patient been a risk to others within the distant past? No.   [place] Scale:  Flowsheet Row Admission (Current) from 05/04/2024 in [facility] INPATIENT BEHAVIORAL MEDICINE Most recent reading at 05/05/2024  1:33 AM ED from 05/04/2024 in [facility] Most recent reading at 05/04/2024  4:00 PM ED from 03/17/2024 in [facility] Emergency Department at [facility] Most recent reading at 03/17/2024  8:29 AM  C-SSRS RISK CATEGORY Low Risk Low Risk No Risk     Past Medical History:  Past Medical History:  Diagnosis Date   Acne    hx accutane use    ADHD (attention deficit hyperactivity disorder)    Depression    Frostbite    toes   H/O: substance abuse (HCC)    Thyromegaly    History reviewed. No pertinent surgical history. Family History: History reviewed. No pertinent family history.  Social History:  Social History   Substance and Sexual Activity  Alcohol Use Yes   Comment: Occ.     Social History   Substance and Sexual Activity  Drug Use Yes   Types: Amphetamines, Marijuana      Allergies:  No Known Allergies Lab Results:  Results for orders placed or performed during the hospital encounter of 05/04/24 (from the past 48 hours)  CBC with Differential/Platelet     Status: Abnormal   Collection Time: 05/04/24  3:59 PM  Result Value Ref Range   WBC 7.6 4.0 - 10.5 K/uL   RBC 5.04 3.87 - 5.11 MIL/uL   Hemoglobin 15.2 (H) 12.0 - 15.0 g/dL   HCT 55.0  63.9 - 53.9 %   MCV 89.1 80.0 - 100.0 fL   MCH 30.2 26.0 - 34.0 pg   MCHC 33.9 30.0 - 36.0 g/dL   RDW 87.8 88.4 - 84.4 %   Platelets 312 150 - 400 K/uL   nRBC 0.0 0.0 - 0.2 %   Neutrophils Relative % 66 %   Neutro Abs 5.1 1.7 - 7.7 K/uL   Lymphocytes Relative 27 %   Lymphs Abs 2.0 0.7 - 4.0 K/uL   Monocytes Relative 5 %   Monocytes Absolute 0.4 0.1 - 1.0 K/uL   Eosinophils Relative 1 %   Eosinophils Absolute 0.1 0.0 - 0.5 K/uL   Basophils Relative 1 %   Basophils Absolute 0.0 0.0 - 0.1 K/uL   Immature Granulocytes 0 %   Abs Immature Granulocytes 0.02 0.00 - 0.07 K/uL    Comment: Performed at [facility], [address], [place], [place] [address]  Comprehensive metabolic panel     Status: Abnormal   Collection Time: 05/04/24  3:59 PM  Result Value Ref Range   Sodium 135 135 - 145 mmol/L   Potassium 4.1 3.5 - 5.1 mmol/L   Chloride 98 98 - 111 mmol/L   CO2 25 22 - 32 mmol/L   Glucose, Bld 78 70 - 99 mg/dL    Comment: Glucose  reference range applies only to samples taken after fasting for at least 8 hours.   BUN <5 (L) 6 - 20 mg/dL   Creatinine, Ser 8.99 0.44 - 1.00 mg/dL   Calcium 9.2 8.9 - 89.6 mg/dL   Total Protein 7.3 6.5 - 8.1 g/dL   Albumin 4.7 3.5 - 5.0 g/dL   AST 20 15 - 41 U/L   ALT 14 0 - 44 U/L   Alkaline Phosphatase 36 (L) 38 - 126 U/L   Total Bilirubin 0.8 0.0 - 1.2 mg/dL   GFR, Estimated >39 >39 mL/min    Comment: (NOTE) Calculated using the CKD-EPI Creatinine Equation (2021)    Anion gap 12 5 - 15    Comment: Performed at [facility], [address], [place], [place] [address]  Hemoglobin A1c     Status: Abnormal   Collection Time: 05/04/24  3:59 PM  Result Value Ref Range   Hgb A1c MFr Bld 4.5 (L) 4.8 - 5.6 %    Comment: (NOTE) Diagnosis of Diabetes The following HbA1c ranges recommended by the American Diabetes Association (ADA) may be used as an aid in the diagnosis of diabetes mellitus.  Hemoglobin             Suggested A1C NGSP%               Diagnosis  <5.7                   Non Diabetic  5.7-6.4                Pre-Diabetic  >6.4                   Diabetic  <7.0                   Glycemic control for                       adults with diabetes.     Mean Plasma Glucose 82.45 mg/dL    Comment: Performed at [facility], [address], [place], [place] [address]  TSH     Status: None   Collection Time: 05/04/24  3:59 PM  Result Value Ref Range   TSH 0.643 0.350 - 4.500 uIU/mL    Comment: Performed by a 3rd Generation assay with a functional sensitivity of <=0.01 uIU/mL. Performed at [facility], [address] [place], [place] [address]   POC urine preg, ED     Status: None   Collection Time: 05/04/24  5:53 PM  Result Value Ref Range   Preg Test, Ur Negative Negative  POCT Urine Drug Screen - (I-Screen)     Status: Abnormal   Collection Time: 05/04/24  5:53 PM  Result Value Ref Range   POC Amphetamine UR Positive (A) NONE DETECTED (Cut Off Level 1000 ng/mL)   POC Secobarbital (BAR) None Detected NONE DETECTED (Cut Off Level 300 ng/mL)   POC Buprenorphine (BUP) None Detected NONE DETECTED (Cut Off Level 10 ng/mL)   POC Oxazepam (BZO) None Detected NONE DETECTED (Cut Off Level 300 ng/mL)   POC Cocaine UR None Detected NONE DETECTED (Cut Off Level 300 ng/mL)   POC Methamphetamine UR None Detected NONE DETECTED (Cut Off Level 1000 ng/mL)   POC Morphine  None Detected NONE DETECTED (Cut Off Level 300 ng/mL)   POC Methadone UR None Detected NONE DETECTED (Cut Off Level 300 ng/mL)   POC  Oxycodone UR None Detected NONE DETECTED (Cut Off Level 100 ng/mL)   POC Marijuana UR Positive (A) NONE DETECTED (Cut Off Level 50 ng/mL)    Blood Alcohol level:  No results found for: [facility]  Metabolic Disorder Labs:  Lab Results  Component Value Date   HGBA1C 4.5 (L) 05/04/2024   MPG 82.45 05/04/2024   No results found for: PROLACTIN Lab Results  Component Value Date   CHOL 133 10/12/2012   TRIG 58.0  10/12/2012   HDL 54.10 10/12/2012   CHOLHDL 2 10/12/2012   VLDL 11.6 10/12/2012   LDLCALC 67 10/12/2012    Current Medications: Current Facility-Administered Medications  Medication Dose Route Frequency Provider Last Rate Last Admin   acetaminophen  (TYLENOL ) tablet 650 mg  650 mg Oral Q6H PRN McLauchlin, Angela, NP       alum & mag hydroxide-simeth (MAALOX/MYLANTA) 200-200-20 MG/5ML suspension 30 mL  30 mL Oral Q4H PRN McLauchlin, Angela, NP       dexmethylphenidate (FOCALIN XR) 24 hr capsule 20 mg  20 mg Oral Daily McLauchlin, Angela, NP       Dextromethorphan-buPROPion ER 45-105 MG TBCR 1 tablet  1 tablet Oral BID McLauchlin, Angela, NP   1 tablet at 05/05/24 1806   hydrOXYzine (ATARAX) tablet 25 mg  25 mg Oral TID PRN McLauchlin, Angela, NP   25 mg at 05/05/24 1405   PTA Medications: Medications Prior to Admission  Medication Sig Dispense Refill Last Dose/Taking   Acetaminophen  (MIDOL PO) Take 1 tablet by mouth daily as needed (Cramps).      AUVELITY 45-105 MG TBCR Take 1 tablet by mouth in the morning and at bedtime.      dexmethylphenidate (FOCALIN XR) 20 MG 24 hr capsule Take 20 mg by mouth daily.      diphenhydrAMINE HCl, Sleep, (ZZZQUIL PO) Take 1 tablet by mouth at bedtime as needed (Sleep).      Esketamine HCl, 56 MG Dose, (SPRAVATO, 56 MG DOSE,) 28 MG/DEVICE SOPK Place 56 mg into the nose 2 (two) times a week.      hydrOXYzine (ATARAX) 25 MG tablet Take 25 mg by mouth 3 (three) times daily as needed for anxiety.      ibuprofen (ADVIL) 200 MG tablet Take 200 mg by mouth every 6 (six) hours as needed for headache or cramping.      levonorgestrel (KYLEENA) 19.5 MG IUD 1 each by Intrauterine route once.      lisdexamfetamine (VYVANSE) 20 MG capsule Take 20 mg by mouth daily as needed (Attention).      mirtazapine (REMERON) 15 MG tablet Take 15 mg by mouth at bedtime. (Patient not taking: Reported on 05/04/2024)      Multiple Vitamin (MULTIVITAMIN PO) Take 1 Dose by mouth daily.  Take 1 gummy every day.      ondansetron  (ZOFRAN -ODT) 4 MG disintegrating tablet Take 1 tablet (4 mg total) by mouth every 8 (eight) hours as needed for nausea or vomiting. (Patient not taking: Reported on 05/04/2024) 15 tablet 0    propranolol (INDERAL) 20 MG tablet Take 20 mg by mouth 3 (three) times daily.      SALINE NASAL MIST NA Place 1 spray into the nose See admin instructions. Use one spray in each nostril before administration of Spravato      traZODone (DESYREL) 50 MG tablet Take 50 mg by mouth at bedtime. (Patient not taking: Reported on 05/04/2024)       Psychiatric Specialty Exam:  Presentation  General Appearance:  Disheveled  Eye Contact: Fair  Speech: Clear and Coherent  Speech Volume: Normal    Mood and Affect  Mood: Depressed; Anxious  Affect: Labile   Thought Process  Thought Processes: Coherent  Descriptions of Associations:Circumstantial  Orientation:Full (Time, Place and Person)  Thought Content:WDL  Hallucinations:Hallucinations: None  Ideas of Reference:None  Suicidal Thoughts:Suicidal Thoughts: Yes, Passive SI Passive Intent and/or Plan: Without Intent; Without Plan  Homicidal Thoughts:Homicidal Thoughts: No   Sensorium  Memory: Immediate Fair; Recent Fair; Remote Fair  Judgment: Poor  Insight: Fair   Chartered certified accountant: Poor  Attention Span: Fair  Recall: [employer] of Knowledge: Fair  Language: Fair   Psychomotor Activity  Psychomotor Activity: Psychomotor Activity: Normal   Assets  Assets: Manufacturing systems engineer; Desire for Improvement; Housing; Social Support    Musculoskeletal: Strength & Muscle Tone: within normal limits Gait & Station: normal  Physical Exam: Physical Exam Vitals and nursing note reviewed.  Constitutional:      Appearance: Normal appearance.  HENT:     Head: Normocephalic and atraumatic.     Nose: Nose normal.     Mouth/Throat:     Mouth: Mucous  membranes are moist.  Eyes:     Conjunctiva/sclera: Conjunctivae normal.  Pulmonary:     Effort: Pulmonary effort is normal.  Neurological:     Mental Status: She is alert and oriented to person, place, and time.  Psychiatric:        Attention and Perception: Attention normal. She does not perceive auditory or visual hallucinations.        Mood and Affect: Mood is anxious and depressed. Affect is labile.        Speech: Speech normal.        Behavior: Behavior is cooperative.        Thought Content: Thought content is not paranoid or delusional. Thought content does not include homicidal or suicidal ideation. Thought content does not include homicidal or suicidal plan.        Cognition and Memory: Cognition normal.        Judgment: Judgment normal.    Review of Systems  Psychiatric/Behavioral:  Positive for depression. Negative for hallucinations and suicidal ideas. The patient is nervous/anxious. The patient does not have insomnia.    Blood pressure 116/87, pulse 66, temperature 98.1 F (36.7 C), temperature source Oral, resp. rate 16, height 5' 5 (1.651 m), weight 64 kg, SpO2 100%. Body mass index is 23.46 kg/m.  Principal Diagnosis: Major depressive disorder, recurrent, severe without psychotic features (HCC) Diagnosis:  Principal Problem:   Major depressive disorder, recurrent, severe without psychotic features [facility])   Clinical Decision Making: Patient is admitted to the adult inpatient unit for stabilization due to suicidal ideation on presentation and self-harm in the context of worsening depression and anxiety.  Treatment Plan Summary:  Safety and Monitoring:             -- Voluntary admission to inpatient psychiatric unit for safety, stabilization and treatment             -- Daily contact with patient to assess and evaluate symptoms and progress in treatment             -- Patient's case to be discussed in multi-disciplinary team meeting             -- Observation Level:  q15 minute checks             -- Vital signs:  q12 hours             --  Precautions: suicide, elopement, and assault   2. Psychiatric Diagnoses and Treatment:              MDD, recurrent, severe, without psychotic features             GAD             PTSD Continue home medications Auvelity, Focalin, hydroxyzine   -- The risks/benefits/side-effects/alternatives to this medication were discussed in detail with the patient and time was given for questions. The patient consents to medication trial.                -- Metabolic profile and EKG monitoring obtained while on an atypical antipsychotic (BMI: Lipid Panel: HbgA1c: QTc:)              -- Encouraged patient to participate in unit milieu and in scheduled group therapies                            3. Medical Issues Being Addressed:  No acute medical issues identified at this time   4. Discharge Planning:              -- Social work and case management to assist with discharge planning and identification of hospital follow-up needs prior to discharge             -- Estimated LOS: 5-7 days             -- Discharge Concerns: Need to establish a safety plan; Medication compliance and effectiveness             -- Discharge Goals: Return home with outpatient referrals follow ups  Physician Treatment Plan for Primary Diagnosis: Major depressive disorder, recurrent, severe without psychotic features (HCC) Long Term Goal(s): Improvement in symptoms so as ready for discharge  Short Term Goals: Ability to identify changes in lifestyle to reduce recurrence of condition will improve, Ability to verbalize feelings will improve, Ability to demonstrate self-control will improve, and Ability to identify and develop effective coping behaviors will improve  Physician Treatment Plan for Secondary Diagnosis: Principal Problem:   Major depressive disorder, recurrent, severe without psychotic features (HCC)  Long Term Goal(s): Improvement in symptoms so as  ready for discharge  Short Term Goals: Ability to identify changes in lifestyle to reduce recurrence of condition will improve, Ability to verbalize feelings will improve, and Ability to identify and develop effective coping behaviors will improve  I certify that inpatient services furnished can reasonably be expected to improve the patient's condition.    Jacqueline Griffith LITTIE Lukes, PA-C 10/23/20257:52 PM

## 2024-05-06 MED ORDER — TRAZODONE HCL 50 MG PO TABS
50.0000 mg | ORAL_TABLET | Freq: Every evening | ORAL | Status: DC | PRN
  Administered 2024-05-07 – 2024-05-10 (×4): 50 mg via ORAL
  Filled 2024-05-06 (×4): qty 1

## 2024-05-06 MED ORDER — TRAZODONE HCL 50 MG PO TABS: 50.0000 mg | ORAL_TABLET | Freq: Every day | ORAL | Status: DC

## 2024-05-06 NOTE — Group Note (Signed)
 Recreation Therapy Group Note   Group Topic:Leisure Education  Group Date: 05/06/2024 Start Time: 1100 End Time: 1150 Facilitators: Celestia Jeoffrey BRAVO, LRT, CTRS Location: Craft Room  Group Description: Leisure. Patients were given the option to choose from journaling, coloring, drawing, making origami, playing with playdoh, listening to music or singing karaoke. LRT and pts discussed the meaning of leisure, the importance of participating in leisure during their free time/when they're outside of the hospital, as well as how our leisure interests can also serve as coping skills.   Goal Area(s) Addressed:  Patient will identify a current leisure interest.  Patient will learn the definition of "leisure". Patient will practice making a positive decision. Patient will have the opportunity to try a new leisure activity. Patient will communicate with peers and LRT.   Affect/Mood: Appropriate   Participation Level: Active and Engaged   Participation Quality: Independent   Behavior: Calm and Cooperative   Speech/Thought Process: Coherent   Insight: Fair   Judgement: Fair    Modes of Intervention: Education, Exploration, and Music   Patient Response to Interventions:  Attentive, Engaged, and Receptive   Education Outcome:  Acknowledges education   Clinical Observations/Individualized Feedback: Jacqueline Griffith was active in their participation of session activities and group discussion. Pt identified storm chase and build things as things she does in her free time.    Plan: Continue to engage patient in RT group sessions 2-3x/week.   Jeoffrey BRAVO Celestia, LRT, CTRS 05/06/2024 1:15 PM

## 2024-05-06 NOTE — Group Note (Signed)
 Recreation Therapy Group Note   Group Topic:Health and Wellness  Group Date: 05/06/2024 Start Time: 1510 End Time: 1600 Facilitators: Celestia Jeoffrey BRAVO, LRT, CTRS Location: Courtyard  Group Description: [employer]. LRT and patients played games of basketball, drew with chalk, and played corn hole while outside in the courtyard while getting fresh air and sunlight. Music was being played in the background. LRT and peers conversed about different games they have played before, what they do in their free time and anything else that is on their minds. LRT encouraged pts to drink water  after being outside, sweating and getting their heart rate up.  Goal Area(s) Addressed: Patient will build on frustration tolerance skills. Patients will partake in a competitive play game with peers. Patients will gain knowledge of new leisure interest/hobby.    Affect/Mood: N/A   Participation Level: Did not attend    Clinical Observations/Individualized Feedback: Patient did not attend group.   Plan: Continue to engage patient in RT group sessions 2-3x/week.   Jeoffrey BRAVO Celestia, LRT, CTRS 05/06/2024 4:57 PM

## 2024-05-06 NOTE — Group Note (Signed)
 Date:  05/06/2024 Time:  5:49 PM  Group Topic/Focus:  Wellness Toolbox:   The focus of this group is to discuss various aspects of wellness, balancing those aspects and exploring ways to increase the ability to experience wellness.  Patients will create a wellness toolbox for use upon discharge.    Participation Level:  Active  Participation Quality:  Appropriate  Affect:  Appropriate  Cognitive:  Appropriate  Insight: Appropriate  Engagement in Group:  Engaged  Modes of Intervention:  Activity and Socialization  Additional Comments:    Deitra Caron [place] 05/06/2024, 5:49 PM

## 2024-05-06 NOTE — Plan of Care (Signed)

## 2024-05-06 NOTE — Progress Notes (Signed)
 [facility] MD Progress Note  05/06/2024 8:51 AM Jacqueline Griffith  MRN:  [id number]   Subjective:  Chart reviewed, case discussed in multidisciplinary meeting, patient seen during rounds.   10/24: On interview today, patient is found resting in bed.  She is calm and cooperative, alert and oriented.  She rates depression as 8 out of 10 and anxiety as 6-7 out of 10 today.  She denies SI/HI/plan and denies hallucinations.  She endorses thoughts of self-harm but has not acted on these thoughts, states she is able to redirect herself.  She reports current medication regimen of Auvelity and hydroxyzine is well-tolerated.  She declines medication changes at this time.  She is considering entering a residential treatment facility upon hospital discharge.  She reports her parents have recently returned home from [place] and will be visiting her this evening, which she is looking forward to.  She identifies her goals for treatment include identifying her triggers for anxiety and learning to work around these triggers.  She is future oriented, states she is looking forward to attending a storm chasing convention.  Sleep: Good  Appetite:  Fair  Past Psychiatric History: see h&P Family History: History reviewed. No pertinent family history. Social History:  Social History   Substance and Sexual Activity  Alcohol Use Yes   Comment: Occ.     Social History   Substance and Sexual Activity  Drug Use Yes   Types: Amphetamines, Marijuana    Social History   Socioeconomic History   Marital status: Single    Spouse name: Not on file   Number of children: Not on file   Years of education: Not on file   Highest education level: Not on file  Occupational History   Not on file  Tobacco Use   Smoking status: Former    Current packs/day: 0.00    Types: Cigarettes    Quit date: 06/13/2009    Years since quitting: 14.9   Smokeless tobacco: Not on file  Substance and Sexual Activity   Alcohol use: Yes     Comment: Occ.   Drug use: Yes    Types: Amphetamines, Marijuana   Sexual activity: Yes    Birth control/protection: None  Other Topics Concern   Not on file  Social History Narrative   Daily Caffeine   [employer] student   Quit tobacco 12 /10   Lives near campus               Social Drivers of Health   Financial Resource Strain: Not on file  Food Insecurity: No Food Insecurity (05/05/2024)   Hunger Vital Sign    Worried About Running Out of Food in the Last Year: Never true    Ran Out of Food in the Last Year: Never true  Transportation Needs: No Transportation Needs (05/05/2024)   PRAPARE - Administrator, Civil Service (Medical): No    Lack of Transportation (Non-Medical): No  Physical Activity: Not on file  Stress: Not on file  Social Connections: Moderately Isolated (05/05/2024)   Social Connection and Isolation Panel    Frequency of Communication with Friends and Family: Three times a week    Frequency of Social Gatherings with Friends and Family: Twice a week    Attends Religious Services: Never    Database administrator or Organizations: No    Attends Banker Meetings: Never    Marital Status: Living with partner   Past Medical History:  Past Medical History:  Diagnosis Date   Acne    hx accutane use    ADHD (attention deficit hyperactivity disorder)    Depression    Frostbite    toes   H/O: substance abuse (HCC)    Thyromegaly    History reviewed. No pertinent surgical history.  Current Medications: Current Facility-Administered Medications  Medication Dose Route Frequency Provider Last Rate Last Admin   acetaminophen  (TYLENOL ) tablet 650 mg  650 mg Oral Q6H PRN McLauchlin, Angela, NP       alum & mag hydroxide-simeth (MAALOX/MYLANTA) 200-200-20 MG/5ML suspension 30 mL  30 mL Oral Q4H PRN McLauchlin, Angela, NP       dexmethylphenidate (FOCALIN XR) 24 hr capsule 20 mg  20 mg Oral Daily McLauchlin, Angela, NP        Dextromethorphan-buPROPion ER 45-105 MG TBCR 1 tablet  1 tablet Oral BID McLauchlin, Angela, NP   1 tablet at 05/06/24 0840   hydrOXYzine (ATARAX) tablet 25 mg  25 mg Oral TID PRN McLauchlin, Jon, NP   25 mg at 05/05/24 2119    Lab Results:  Results for orders placed or performed during the hospital encounter of 05/04/24 (from the past 48 hours)  CBC with Differential/Platelet     Status: Abnormal   Collection Time: 05/04/24  3:59 PM  Result Value Ref Range   WBC 7.6 4.0 - 10.5 K/uL   RBC 5.04 3.87 - 5.11 MIL/uL   Hemoglobin 15.2 (H) 12.0 - 15.0 g/dL   HCT 55.0 63.9 - 53.9 %   MCV 89.1 80.0 - 100.0 fL   MCH 30.2 26.0 - 34.0 pg   MCHC 33.9 30.0 - 36.0 g/dL   RDW 87.8 88.4 - 84.4 %   Platelets 312 150 - 400 K/uL   nRBC 0.0 0.0 - 0.2 %   Neutrophils Relative % 66 %   Neutro Abs 5.1 1.7 - 7.7 K/uL   Lymphocytes Relative 27 %   Lymphs Abs 2.0 0.7 - 4.0 K/uL   Monocytes Relative 5 %   Monocytes Absolute 0.4 0.1 - 1.0 K/uL   Eosinophils Relative 1 %   Eosinophils Absolute 0.1 0.0 - 0.5 K/uL   Basophils Relative 1 %   Basophils Absolute 0.0 0.0 - 0.1 K/uL   Immature Granulocytes 0 %   Abs Immature Granulocytes 0.02 0.00 - 0.07 K/uL    Comment: Performed at [name], [address] [place], [place] [address]  Comprehensive metabolic panel     Status: Abnormal   Collection Time: 05/04/24  3:59 PM  Result Value Ref Range   Sodium 135 135 - 145 mmol/L   Potassium 4.1 3.5 - 5.1 mmol/L   Chloride 98 98 - 111 mmol/L   CO2 25 22 - 32 mmol/L   Glucose, Bld 78 70 - 99 mg/dL    Comment: Glucose reference range applies only to samples taken after fasting for at least 8 hours.   BUN <5 (L) 6 - 20 mg/dL   Creatinine, Ser 8.99 0.44 - 1.00 mg/dL   Calcium 9.2 8.9 - 89.6 mg/dL   Total Protein 7.3 6.5 - 8.1 g/dL   Albumin 4.7 3.5 - 5.0 g/dL   AST 20 15 - 41 U/L   ALT 14 0 - 44 U/L   Alkaline Phosphatase 36 (L) 38 - 126 U/L   Total Bilirubin 0.8 0.0 - 1.2 mg/dL   GFR, Estimated  >39 >39 mL/min    Comment: (NOTE) Calculated using the CKD-EPI Creatinine Equation (2021)  Anion gap 12 5 - 15    Comment: Performed at [facility], [address] [place], [place] [address]  Hemoglobin A1c     Status: Abnormal   Collection Time: 05/04/24  3:59 PM  Result Value Ref Range   Hgb A1c MFr Bld 4.5 (L) 4.8 - 5.6 %    Comment: (NOTE) Diagnosis of Diabetes The following HbA1c ranges recommended by the American Diabetes Association (ADA) may be used as an aid in the diagnosis of diabetes mellitus.  Hemoglobin             Suggested A1C NGSP%              Diagnosis  <5.7                   Non Diabetic  5.7-6.4                Pre-Diabetic  >6.4                   Diabetic  <7.0                   Glycemic control for                       adults with diabetes.     Mean Plasma Glucose 82.45 mg/dL    Comment: Performed at [facility], [address] [place], [place] [address]  TSH     Status: None   Collection Time: 05/04/24  3:59 PM  Result Value Ref Range   TSH 0.643 0.350 - 4.500 uIU/mL    Comment: Performed by a 3rd Generation assay with a functional sensitivity of <=0.01 uIU/mL. Performed at [facility], [address], [place], [place] [address]   POC urine preg, ED     Status: None   Collection Time: 05/04/24  5:53 PM  Result Value Ref Range   Preg Test, Ur Negative Negative  POCT Urine Drug Screen - (I-Screen)     Status: Abnormal   Collection Time: 05/04/24  5:53 PM  Result Value Ref Range   POC Amphetamine UR Positive (A) NONE DETECTED (Cut Off Level 1000 ng/mL)   POC Secobarbital (BAR) None Detected NONE DETECTED (Cut Off Level 300 ng/mL)   POC Buprenorphine (BUP) None Detected NONE DETECTED (Cut Off Level 10 ng/mL)   POC Oxazepam (BZO) None Detected NONE DETECTED (Cut Off Level 300 ng/mL)   POC Cocaine UR None Detected NONE DETECTED (Cut Off Level 300 ng/mL)   POC Methamphetamine UR None Detected NONE DETECTED (Cut Off Level 1000  ng/mL)   POC Morphine  None Detected NONE DETECTED (Cut Off Level 300 ng/mL)   POC Methadone UR None Detected NONE DETECTED (Cut Off Level 300 ng/mL)   POC Oxycodone UR None Detected NONE DETECTED (Cut Off Level 100 ng/mL)   POC Marijuana UR Positive (A) NONE DETECTED (Cut Off Level 50 ng/mL)    Blood Alcohol level:  No results found for: [facility]  Metabolic Disorder Labs: Lab Results  Component Value Date   HGBA1C 4.5 (L) 05/04/2024   MPG 82.45 05/04/2024   No results found for: PROLACTIN Lab Results  Component Value Date   CHOL 133 10/12/2012   TRIG 58.0 10/12/2012   HDL 54.10 10/12/2012   CHOLHDL 2 10/12/2012   VLDL 11.6 10/12/2012   LDLCALC 67 10/12/2012    Physical Findings: AIMS:  , ,  ,  ,  CIWA:    COWS:      Psychiatric Specialty Exam:  Presentation  General Appearance:  Disheveled  Eye Contact: Fair  Speech: Clear and Coherent  Speech Volume: Normal    Mood and Affect  Mood: Depressed; Anxious  Affect: Labile   Thought Process  Thought Processes: Coherent  Orientation:Full (Time, Place and Person)  Thought Content:WDL  Hallucinations:Hallucinations: None  Ideas of Reference:None  Suicidal Thoughts: No Homicidal Thoughts:Homicidal Thoughts: No   Sensorium  Memory: Immediate Fair; Recent Fair; Remote Fair  Judgment: Fair  Insight: Good   Executive Functions  Concentration: Poor  Attention Span: Fair  Recall: Fair  Fund of Knowledge: Fair  Language: Fair   Psychomotor Activity  Psychomotor Activity: Psychomotor Activity: Normal  Musculoskeletal: Strength & Muscle Tone: within normal limits Gait & Station: normal Assets  Assets: Manufacturing systems engineer; Desire for Improvement; Housing; Social Support    Physical Exam: Physical Exam ROS Blood pressure 115/88, pulse 79, temperature 98.1 F (36.7 C), temperature source Oral, resp. rate 17, height 5' 5 (1.651 m), weight 64 kg, SpO2 100%. Body mass  index is 23.46 kg/m.  Diagnosis: Principal Problem:   Major depressive disorder, recurrent, severe without psychotic features (HCC)   PLAN: Safety and Monitoring:  -- Voluntary admission to inpatient psychiatric unit for safety, stabilization and treatment  -- Daily contact with patient to assess and evaluate symptoms and progress in treatment  -- Patient's case to be discussed in multi-disciplinary team meeting  -- Observation Level : q15 minute checks  -- Vital signs:  q12 hours  -- Precautions: suicide, elopement, and assault -- Encouraged patient to participate in unit milieu and in scheduled group therapies   2. Psychiatric Treatment:  Scheduled Medications:  Continue home medications Auvelity, hydroxyzine; can hold Focalin per patient preference   -- The risks/benefits/side-effects/alternatives to this medication were discussed in detail with the patient and time was given for questions. The patient consents to medication trial.   3. Medical Issues Being Addressed:  No acute medical issues identified at this time   4. Discharge Planning:   -- Social work and case management to assist with discharge planning and identification of hospital follow-up needs prior to discharge  -- Estimated LOS: 5-7 days  Camelia LITTIE Lukes, PA-C 05/06/2024, 8:51 AM

## 2024-05-06 NOTE — Progress Notes (Signed)
 Pt continues to refuse Focalin.  This may affect her ability to concentrate

## 2024-05-06 NOTE — Progress Notes (Signed)
   05/06/24 0840  Psych Admission Type (Psych Patients Only)  Admission Status Voluntary  Psychosocial Assessment  Patient Complaints Anxiety  Eye Contact Fair  Facial Expression Anxious  Affect Sad  Speech Soft  Interaction Minimal  Motor Activity Other (Comment) (WNL)  Appearance/Hygiene Unremarkable;In scrubs  Behavior Characteristics Cooperative;Anxious  Aggressive Behavior  Targets Self  Type of Behavior Other (Comment) (hitting and slapping herself in the face when anxiety peaks)  Effect Self-harm  Thought Process  Coherency WDL  Content WDL  Delusions None reported or observed  Perception WDL  Hallucination None reported or observed  Judgment Impaired  Confusion None  Danger to Self  Current suicidal ideation? Denies  Description of Suicide Plan no plan  Self-Injurious Behavior No self-injurious ideation or behavior indicators observed or expressed   Agreement Not to Harm Self Yes  Description of Agreement Verbal  Danger to Others  Danger to Others None reported or observed

## 2024-05-06 NOTE — BH IP Treatment Plan (Signed)
 Interdisciplinary Treatment and Diagnostic Plan Update  05/06/2024 Time of Session: 10:46 AM Jacqueline Griffith MRN: [id number]  Principal Diagnosis: Major depressive disorder, recurrent, severe without psychotic features (HCC)  Secondary Diagnoses: Principal Problem:   Major depressive disorder, recurrent, severe without psychotic features (HCC)   Current Medications:  Current Facility-Administered Medications  Medication Dose Route Frequency Provider Last Rate Last Admin   acetaminophen  (TYLENOL ) tablet 650 mg  650 mg Oral Q6H PRN McLauchlin, Angela, NP       alum & mag hydroxide-simeth (MAALOX/MYLANTA) 200-200-20 MG/5ML suspension 30 mL  30 mL Oral Q4H PRN McLauchlin, Angela, NP       dexmethylphenidate (FOCALIN XR) 24 hr capsule 20 mg  20 mg Oral Daily McLauchlin, Angela, NP       Dextromethorphan-buPROPion ER 45-105 MG TBCR 1 tablet  1 tablet Oral BID McLauchlin, Angela, NP   1 tablet at 05/06/24 0840   hydrOXYzine (ATARAX) tablet 25 mg  25 mg Oral TID PRN McLauchlin, Angela, NP   25 mg at 05/05/24 2119   PTA Medications: Medications Prior to Admission  Medication Sig Dispense Refill Last Dose/Taking   Acetaminophen  (MIDOL PO) Take 1 tablet by mouth daily as needed (Cramps).      AUVELITY 45-105 MG TBCR Take 1 tablet by mouth in the morning and at bedtime.      dexmethylphenidate (FOCALIN XR) 20 MG 24 hr capsule Take 20 mg by mouth daily.      diphenhydrAMINE HCl, Sleep, (ZZZQUIL PO) Take 1 tablet by mouth at bedtime as needed (Sleep).      Esketamine HCl, 56 MG Dose, (SPRAVATO, 56 MG DOSE,) 28 MG/DEVICE SOPK Place 56 mg into the nose 2 (two) times a week.      hydrOXYzine (ATARAX) 25 MG tablet Take 25 mg by mouth 3 (three) times daily as needed for anxiety.      ibuprofen (ADVIL) 200 MG tablet Take 200 mg by mouth every 6 (six) hours as needed for headache or cramping.      levonorgestrel (KYLEENA) 19.5 MG IUD 1 each by Intrauterine route once.      lisdexamfetamine (VYVANSE) 20  MG capsule Take 20 mg by mouth daily as needed (Attention).      mirtazapine (REMERON) 15 MG tablet Take 15 mg by mouth at bedtime. (Patient not taking: Reported on 05/04/2024)      Multiple Vitamin (MULTIVITAMIN PO) Take 1 Dose by mouth daily. Take 1 gummy every day.      ondansetron  (ZOFRAN -ODT) 4 MG disintegrating tablet Take 1 tablet (4 mg total) by mouth every 8 (eight) hours as needed for nausea or vomiting. (Patient not taking: Reported on 05/04/2024) 15 tablet 0    propranolol (INDERAL) 20 MG tablet Take 20 mg by mouth 3 (three) times daily.      SALINE NASAL MIST NA Place 1 spray into the nose See admin instructions. Use one spray in each nostril before administration of Spravato      traZODone (DESYREL) 50 MG tablet Take 50 mg by mouth at bedtime. (Patient not taking: Reported on 05/04/2024)       Patient Stressors:    Patient Strengths:    Treatment Modalities: Medication Management, Group therapy, Case management,  1 to 1 session with clinician, Psychoeducation, Recreational therapy.   Physician Treatment Plan for Primary Diagnosis: Major depressive disorder, recurrent, severe without psychotic features (HCC) Long Term Goal(s): Improvement in symptoms so as ready for discharge   Short Term Goals: Ability to identify changes in lifestyle to  reduce recurrence of condition will improve Ability to verbalize feelings will improve Ability to identify and develop effective coping behaviors will improve Ability to demonstrate self-control will improve  Medication Management: Evaluate patient's response, side effects, and tolerance of medication regimen.  Therapeutic Interventions: 1 to 1 sessions, Unit Group sessions and Medication administration.  Evaluation of Outcomes: Not Met  Physician Treatment Plan for Secondary Diagnosis: Principal Problem:   Major depressive disorder, recurrent, severe without psychotic features (HCC)  Long Term Goal(s): Improvement in symptoms so as  ready for discharge   Short Term Goals: Ability to identify changes in lifestyle to reduce recurrence of condition will improve Ability to verbalize feelings will improve Ability to identify and develop effective coping behaviors will improve Ability to demonstrate self-control will improve     Medication Management: Evaluate patient's response, side effects, and tolerance of medication regimen.  Therapeutic Interventions: 1 to 1 sessions, Unit Group sessions and Medication administration.  Evaluation of Outcomes: Not Met   RN Treatment Plan for Primary Diagnosis: Major depressive disorder, recurrent, severe without psychotic features (HCC) Long Term Goal(s): Knowledge of disease and therapeutic regimen to maintain health will improve  Short Term Goals: Ability to verbalize frustration and anger appropriately will improve, Ability to demonstrate self-control, Ability to participate in decision making will improve, Ability to verbalize feelings will improve, Ability to disclose and discuss suicidal ideas, and Ability to identify and develop effective coping behaviors will improve  Medication Management: RN will administer medications as ordered by provider, will assess and evaluate patient's response and provide education to patient for prescribed medication. RN will report any adverse and/or side effects to prescribing provider.  Therapeutic Interventions: 1 on 1 counseling sessions, Psychoeducation, Medication administration, Evaluate responses to treatment, Monitor vital signs and CBGs as ordered, Perform/monitor CIWA, COWS, AIMS and Fall Risk screenings as ordered, Perform wound care treatments as ordered.  Evaluation of Outcomes: Progressing   LCSW Treatment Plan for Primary Diagnosis: Major depressive disorder, recurrent, severe without psychotic features (HCC) Long Term Goal(s): Safe transition to appropriate next level of care at discharge, Engage patient in therapeutic group  addressing interpersonal concerns.  Short Term Goals: Engage patient in aftercare planning with referrals and resources, Increase social support, Increase ability to appropriately verbalize feelings, Increase emotional regulation, Facilitate acceptance of mental health diagnosis and concerns, Facilitate patient progression through stages of change regarding substance use diagnoses and concerns, Identify triggers associated with mental health/substance abuse issues, and Increase skills for wellness and recovery  Therapeutic Interventions: Assess for all discharge needs, 1 to 1 time with Social worker, Explore available resources and support systems, Assess for adequacy in community support network, Educate family and significant other(s) on suicide prevention, Complete Psychosocial Assessment, Interpersonal group therapy.  Evaluation of Outcomes: Progressing   Progress in Treatment: Attending groups: Yes. Participating in groups: Yes. Taking medication as prescribed: Yes. Toleration medication: Yes. Family/Significant other contact made: Yes, individual(s) contacted:  Todd Luck, [phone], Partner Patient understands diagnosis: Yes. Discussing patient identified problems/goals with staff: Yes. Medical problems stabilized or resolved: Yes. Denies suicidal/homicidal ideation: Yes. Issues/concerns per patient self-inventory: No. Other: None  New problem(s) identified: No, Describe:  None  New Short Term/Long Term Goal(s):detox, elimination of symptoms of psychosis, medication management for mood stabilization; elimination of SI thoughts; development of comprehensive mental wellness/sobriety plan.    Patient Goals:  I need to get stable.  Discharge Plan or Barriers: CSW to assist with the development of appropriate discharge plan.    Reason for Continuation  of Hospitalization: Anxiety Depression Hallucinations Suicidal ideation  Estimated Length of Stay: 1-7 days.  Last 3  [place] Suicide Severity Risk Score: Flowsheet Row Admission (Current) from 05/04/2024 in [facility] Most recent reading at 05/05/2024  1:33 AM ED from 05/04/2024 in [facility] Most recent reading at 05/04/2024  4:00 PM ED from 03/17/2024 in [facility] Emergency Department at [facility] Most recent reading at 03/17/2024  8:29 AM  C-SSRS RISK CATEGORY Low Risk Low Risk No Risk    Last PHQ 2/9 Scores:     No data to display          Scribe for Treatment Team: Alveta CHRISTELLA Kerns, LCSW 05/06/2024 11:09 AM

## 2024-05-06 NOTE — B[facility] Counselor (Signed)
  CSW and patient discussed treatment options. Patient expressed possible interest in residential treatment. CSW spoke with patient about [facility] inpatient treatment option. Patient to speak to family more before deciding. CSW sent referral to [facility] just in case to provide options for the patient.   Chasity Outten, MSW, LCSWA 05/06/2024 12:27 PM

## 2024-05-07 NOTE — Group Note (Signed)
 Date:  05/07/2024 Time:  9:16 PM  Group Topic/Focus:  Wrap-Up Group:   The focus of this group is to help patients review their daily goal of treatment and discuss progress on daily workbooks.    Participation Level:  Active  Participation Quality:  Appropriate and Attentive  Affect:  Appropriate  Cognitive:  Alert and Appropriate  Insight: Appropriate  Engagement in Group:  Engaged  Modes of Intervention:  Discussion  Additional Comments:     Maglione,Jasmeen Fritsch E 05/07/2024, 9:16 PM

## 2024-05-07 NOTE — Group Note (Signed)
 Date:  05/07/2024 Time:  1:57 PM  Group Topic/Focus:    Chief Of Staff. Patients were given the opportunity to go outside to the courtyard to play basketball, corn-hole, draw with chalk and listen to music while enjoying fresh air and sunlight.    Participation Level:  Active  Participation Quality:  Appropriate   Jacqueline Griffith 05/07/2024, 1:57 PM

## 2024-05-07 NOTE — Progress Notes (Signed)
   05/06/24 2000  Psych Admission Type (Psych Patients Only)  Admission Status Voluntary  Psychosocial Assessment  Patient Complaints Anxiety  Eye Contact Fair  Facial Expression Anxious  Affect Sad  Speech Soft;Logical/coherent  Interaction Minimal  Motor Activity Slow  Appearance/Hygiene Improved  Behavior Characteristics Cooperative  Mood Depressed  Thought Process  Coherency WDL  Content WDL  Delusions None reported or observed  Perception WDL  Hallucination None reported or observed  Judgment Impaired  Confusion None  Danger to Self  Current suicidal ideation? Denies  Self-Injurious Behavior No self-injurious ideation or behavior indicators observed or expressed   Agreement Not to Harm Self Yes  Description of Agreement verbal  Danger to Others  Danger to Others None reported or observed   No distress noted interacting appropriately with peers and staff, thoughts are organized and coherent, 15 minutes safety checks maintained.

## 2024-05-07 NOTE — Group Note (Signed)
 Date:  05/07/2024 Time:  2:31 AM    Additional Comments:  Did not attend group.  Jacqueline Griffith 05/07/2024, 2:31 AM

## 2024-05-07 NOTE — Plan of Care (Signed)
  Problem: Education: Goal: Knowledge of [facility] Education information/materials will improve Outcome: Progressing   Problem: Education: Goal: Emotional status will improve Outcome: Progressing   Problem: Education: Goal: Mental status will improve Outcome: Progressing   

## 2024-05-07 NOTE — Group Note (Signed)
 Recreation Therapy Group Note   Group Topic:Goal Setting  Group Date: 05/07/2024 Start Time: 1000 End Time: 1040 Facilitators: Celestia Jeoffrey BRAVO, LRT, CTRS Location: Craft Room  Group description: Now Future Wall. Patients were given a sheet of paper and asked to fold it into 3 sections, like a pamphlet. Top section, patients were encouraged to write what they are feeling or experiencing "now". The bottom section, patients were asked to fill out how they want to feel or things they want to experience in the "future".  In the middle section, patients were encouraged to fill out any "walls" or barriers that are getting in the way of them reaching their "future". On the back of the sheet, patients were encouraged to write positive coping skills that will help them get over or though the walls they experience. LRT and patients discussed each of the sections and shared them aloud in group. Patients are encouraged to keep this paper with them as a guide/plan post discharge.     Goal Area(s) Addressed:    Patients will identify walls/triggers.   Patients will identify and list coping skills to use post-discharge.   Patients will work on goal setting, short or long-term.   Patients will work on communication by reading aloud to group and engaging in post activity discussion.    Affect/Mood: N/A   Participation Level: Did not attend    Clinical Observations/Individualized Feedback: Patient did not attend group.   Plan: Continue to engage patient in RT group sessions 2-3x/week.   Jeoffrey BRAVO Celestia, LRT, CTRS 05/07/2024 10:57 AM

## 2024-05-07 NOTE — Progress Notes (Signed)
   05/07/24 0900  Psych Admission Type (Psych Patients Only)  Admission Status Voluntary  Psychosocial Assessment  Patient Complaints Anxiety  Eye Contact Fair  Facial Expression Anxious  Affect Sad  Speech Soft  Interaction Minimal  Motor Activity Slow  Appearance/Hygiene Unremarkable  Behavior Characteristics Cooperative  Mood Depressed  Thought Process  Coherency WDL  Content WDL  Delusions None reported or observed  Perception WDL  Hallucination None reported or observed  Judgment Impaired  Confusion None  Danger to Self  Current suicidal ideation? Denies  Self-Injurious Behavior No self-injurious ideation or behavior indicators observed or expressed   Agreement Not to Harm Self Yes  Description of Agreement verbal  Danger to Others  Danger to Others None reported or observed

## 2024-05-07 NOTE — Progress Notes (Signed)
 [facility] MD Progress Note  05/07/2024 4:50 PM Jacqueline Griffith  MRN:  [id number]   Subjective:  Chart reviewed, case discussed in multidisciplinary meeting, patient seen during rounds.   Upon assessment patient presents appropriate. She reports feeling like her medication is effective. She denies SI, HI, or AVH. Reports intermittent thoughts to harm herself but denies any recent harm. Identifies various coping skills such as placing face in cold water or breathing techniques. She is seen throughout the day being active in the milieu.    Sleep: Good  Appetite:  Good  Past Psychiatric History: see h&P Family History: History reviewed. No pertinent family history. Social History:  Social History   Substance and Sexual Activity  Alcohol Use Yes   Comment: Occ.     Social History   Substance and Sexual Activity  Drug Use Yes   Types: Amphetamines, Marijuana    Social History   Socioeconomic History   Marital status: Single    Spouse name: Not on file   Number of children: Not on file   Years of education: Not on file   Highest education level: Not on file  Occupational History   Not on file  Tobacco Use   Smoking status: Former    Current packs/day: 0.00    Types: Cigarettes    Quit date: 06/13/2009    Years since quitting: 14.9   Smokeless tobacco: Not on file  Substance and Sexual Activity   Alcohol use: Yes    Comment: Occ.   Drug use: Yes    Types: Amphetamines, Marijuana   Sexual activity: Yes    Birth control/protection: None  Other Topics Concern   Not on file  Social History Narrative   Daily Caffeine   [employer] student   Quit tobacco 12 /10   Lives near campus               Social Drivers of Health   Financial Resource Strain: Not on file  Food Insecurity: No Food Insecurity (05/05/2024)   Hunger Vital Sign    Worried About Running Out of Food in the Last Year: Never true    Ran Out of Food in the Last Year: Never true  Transportation Needs: No  Transportation Needs (05/05/2024)   PRAPARE - Administrator, Civil Service (Medical): No    Lack of Transportation (Non-Medical): No  Physical Activity: Not on file  Stress: Not on file  Social Connections: Moderately Isolated (05/05/2024)   Social Connection and Isolation Panel    Frequency of Communication with Friends and Family: Three times a week    Frequency of Social Gatherings with Friends and Family: Twice a week    Attends Religious Services: Never    Database Administrator or Organizations: No    Attends Engineer, Structural: Never    Marital Status: Living with partner   Past Medical History:  Past Medical History:  Diagnosis Date   Acne    hx accutane use    ADHD (attention deficit hyperactivity disorder)    Depression    Frostbite    toes   H/O: substance abuse (HCC)    Thyromegaly    History reviewed. No pertinent surgical history.  Current Medications: Current Facility-Administered Medications  Medication Dose Route Frequency Provider Last Rate Last Admin   acetaminophen  (TYLENOL ) tablet 650 mg  650 mg Oral Q6H PRN McLauchlin, Angela, NP       alum & mag hydroxide-simeth (MAALOX/MYLANTA) 200-200-20 MG/5ML suspension 30 mL  30 mL Oral Q4H PRN McLauchlin, Angela, NP       dexmethylphenidate (FOCALIN XR) 24 hr capsule 20 mg  20 mg Oral Daily McLauchlin, Angela, NP       Dextromethorphan-buPROPion ER 45-105 MG TBCR 1 tablet  1 tablet Oral BID McLauchlin, Angela, NP   1 tablet at 05/07/24 0957   hydrOXYzine (ATARAX) tablet 25 mg  25 mg Oral TID PRN McLauchlin, Angela, NP   25 mg at 05/07/24 0848   traZODone (DESYREL) tablet 50 mg  50 mg Oral QHS PRN Ajibola, Ene A, NP        Lab Results: No results found for this or any previous visit (from the past 48 hours).  Blood Alcohol level:  No results found for: [facility]  Metabolic Disorder Labs: Lab Results  Component Value Date   HGBA1C 4.5 (L) 05/04/2024   MPG 82.45 05/04/2024   No results  found for: PROLACTIN Lab Results  Component Value Date   CHOL 133 10/12/2012   TRIG 58.0 10/12/2012   HDL 54.10 10/12/2012   CHOLHDL 2 10/12/2012   VLDL 11.6 10/12/2012   LDLCALC 67 10/12/2012      Psychiatric Specialty Exam:  Presentation  General Appearance:  Appropriate for Environment  Eye Contact: Fair  Speech: Clear and Coherent  Speech Volume: Normal    Mood and Affect  Mood: Euthymic  Affect: Appropriate   Thought Process  Thought Processes: Coherent  Orientation:Full (Time, Place and Person)  Thought Content:WDL  Hallucinations:Hallucinations: None  Ideas of Reference:None  Suicidal Thoughts:Suicidal Thoughts: No  Homicidal Thoughts:Homicidal Thoughts: No   Sensorium  Memory: Immediate Fair; Recent Fair; Remote Fair  Judgment: Fair  Insight: Fair   Art Therapist  Concentration: Fair  Attention Span: Fair  Recall: [employer] of Knowledge: Fair  Language: Fair   Psychomotor Activity  Psychomotor Activity: Psychomotor Activity: Normal  Musculoskeletal: Strength & Muscle Tone: within normal limits Gait & Station: normal Assets  Assets: Manufacturing Systems Engineer; Desire for Improvement; Housing; Social Support    Physical Exam: Physical Exam Vitals and nursing note reviewed.  Constitutional:      Appearance: Normal appearance.  Cardiovascular:     Rate and Rhythm: Normal rate.  Pulmonary:     Effort: Pulmonary effort is normal.  Neurological:     Mental Status: She is alert and oriented to person, place, and time.  Psychiatric:        Mood and Affect: Mood normal.        Behavior: Behavior normal.        Thought Content: Thought content normal.        Judgment: Judgment normal.    Review of Systems  Respiratory:  Negative for shortness of breath.   Cardiovascular:  Negative for chest pain.  Gastrointestinal:  Negative for nausea and vomiting.  Psychiatric/Behavioral:  Negative for  hallucinations and suicidal ideas. The patient is nervous/anxious.   All other systems reviewed and are negative.  Blood pressure 107/72, pulse 60, temperature 98.1 F (36.7 C), temperature source Oral, resp. rate 18, height 5' 5 (1.651 m), weight 64 kg, SpO2 100%. Body mass index is 23.46 kg/m.  Diagnosis: Principal Problem:   Major depressive disorder, recurrent, severe without psychotic features (HCC)   PLAN: Safety and Monitoring:  -- Voluntary admission to inpatient psychiatric unit for safety, stabilization and treatment  -- Daily contact with patient to assess and evaluate symptoms and progress in treatment  -- Patient's case to be discussed in multi-disciplinary team meeting  --  Observation Level : q15 minute checks  -- Vital signs:  q12 hours  -- Precautions: suicide, elopement, and assault -- Encouraged patient to participate in unit milieu and in scheduled group therapies  2. Psychiatric Treatment:  Scheduled Medications: Auvelity daily Hydroxyzine as needed Holding Focalin as per patient preference   -- The risks/benefits/side-effects/alternatives to this medication were discussed in detail with the patient and time was given for questions. The patient consents to medication trial.   3. Medical Issues Being Addressed:  No acute medical concerns at this time.    4. Discharge Planning:   -- Social work and case management to assist with discharge planning and identification of hospital follow-up needs prior to discharge  -- Estimated LOS: 5-7 days  Jacqueline Kulaga, NP 05/07/2024, 4:50 PM

## 2024-05-07 NOTE — Plan of Care (Signed)
   Problem: Education: Goal: Knowledge of [facility] Education information/materials will improve Outcome: Progressing Goal: Emotional status will improve Outcome: Progressing Goal: Mental status will improve Outcome: Progressing Goal: Verbalization of understanding the information provided will improve Outcome: Progressing

## 2024-05-08 LAB — LIPID PANEL
Cholesterol: 145 mg/dL (ref 0–200)
HDL: 37 mg/dL — ABNORMAL LOW (ref >40)
LDL Cholesterol: 91 mg/dL (ref 0–99)
Total CHOL/HDL Ratio: 3.9 ratio
Triglycerides: 84 mg/dL (ref <150)
VLDL: 17 mg/dL (ref 0–40)

## 2024-05-08 MED ORDER — HYDROXYZINE HCL 50 MG PO TABS
50.0000 mg | ORAL_TABLET | Freq: Three times a day (TID) | ORAL | Status: DC | PRN
  Administered 2024-05-08 – 2024-05-11 (×7): 50 mg via ORAL
  Filled 2024-05-08 (×7): qty 1

## 2024-05-08 NOTE — Group Note (Signed)
 Date:  05/08/2024 Time:  10:35 AM  Group Topic/Focus:  Relapse Prevention Planning:   The focus of this group is to define relapse and discuss the need for planning to combat relapse.    Participation Level:  Did Not Attend   Jacqueline Griffith 05/08/2024, 10:35 AM

## 2024-05-08 NOTE — Progress Notes (Signed)
   05/08/24 1000  Psych Admission Type (Psych Patients Only)  Admission Status Voluntary  Psychosocial Assessment  Patient Complaints Anxiety  Eye Contact Fair  Facial Expression Anxious  Affect Sad  Speech Soft  Interaction Minimal  Motor Activity Slow  Appearance/Hygiene Unremarkable  Behavior Characteristics Cooperative  Mood Depressed  Thought Process  Coherency WDL  Content WDL  Delusions None reported or observed  Perception WDL  Hallucination None reported or observed  Judgment Impaired  Confusion None  Danger to Self  Current suicidal ideation? Denies  Self-Injurious Behavior Some self-injurious ideation observed or expressed.  No lethal plan expressed   Agreement Not to Harm Self Yes  Description of Agreement verbal  Danger to Others  Danger to Others None reported or observed

## 2024-05-08 NOTE — Plan of Care (Signed)
   Problem: Education: Goal: Emotional status will improve Outcome: Progressing

## 2024-05-08 NOTE — Group Note (Signed)
 LCSW Group Therapy Note  Group Date: 05/08/2024 Start Time: 1300 End Time: 1400   Type of Therapy and Topic:  Group Therapy: Positive Affirmations  Participation Level:  Active   Description of Group:   This group addressed positive affirmation towards self and others.  Patients went around the room and identified two positive things about themselves and two positive things about a peer in the room.  Patients reflected on how it felt to share something positive with others, to identify positive things about themselves, and to hear positive things from others/ Patients were encouraged to have a daily reflection of positive characteristics or circumstances.   Therapeutic Goals: Patients will verbalize two of their positive qualities Patients will demonstrate empathy for others by stating two positive qualities about a peer in the group Patients will verbalize their feelings when voicing positive self affirmations and when voicing positive affirmations of others Patients will discuss the potential positive impact on their wellness/recovery of focusing on positive traits of self and others.  Summary of Patient Progress: Patient actively engaged in the discussion and . Patient was able to identify positive affirmations about themself as well as other group members. Patient demonstrated proficient insight into the subject matter, was respectful of peers, participated throughout the entire session.  Therapeutic Modalities:   Cognitive Behavioral Therapy Motivational Interviewing    Jacqueline Griffith, Jacqueline Griffith 05/08/2024  2:57 PM

## 2024-05-08 NOTE — Group Note (Signed)
 Date:  05/08/2024 Time:  4:57 PM  Group Topic/Focus:  Wellness Toolbox:   The focus of this group is to discuss various aspects of wellness, balancing those aspects and exploring ways to increase the ability to experience wellness.  Patients will create a wellness toolbox for use upon discharge.    Participation Level:  Did Not Attend   Deitra Clap [facility] 05/08/2024, 4:57 PM

## 2024-05-08 NOTE — Progress Notes (Signed)
 [facility] MD Progress Note  05/08/2024 1:23 PM Jacqueline Griffith  MRN:  [id number]   Subjective:  Chart reviewed, case discussed in multidisciplinary meeting, patient seen during rounds.   Upon today's interview patient is in her room reading. She presents as dysphoric and tearful. Patient reports that she had a rough phone call last night with her partner. She expresses how she feels like she is communicating and he is not understanding her communicating.   Discussed historical diagnosis of Borderline Personality Disorder. Discussed etiology and recommended treatments. Patient was able to identify how emotional dysregulation was impacting her ability to communicate with her partner. She expressed her previous stay at  [facility] helped her uncover some previous trauma in their relationship that she did not realize was still impacting her. Patient identified she would want to go back to [facility] for treatment. She currently has a therapist through [facility] who she has met with two times. Discussed that therapy is the first line treatment and central focus for this diagnosis.   Discussed ADHD and her taking her Focalin PRN. Discussed how ADHD can also aid in emotional dysregulation, especially if not treated consistently. Patient reports she has been working with her provider and they recently switched her to Focalin due to Vyvanse causing her to not sleep and not eat. Through discussion, patient reported she will take the medication instead to see if this also helps her. Medications will be resumed tomorrow.   Patient denies SI, HI, and AVH. She identifies still having thoughts to self harm, but not wanting to end her life. Identifies she uses this when too emotionally dysregulated/overwhelmed. Discussed her alternative coping skills and utilizing PRN hydroxyzine.    10/25: Upon assessment patient presents appropriate. She reports feeling like her medication is effective. She denies SI, HI, or  AVH. Reports intermittent thoughts to harm herself but denies any recent harm. Identifies various coping skills such as placing face in cold water or breathing techniques. She is seen throughout the day being active in the milieu.    Sleep: Good  Appetite:  Good  Past Psychiatric History: see h&P Family History: History reviewed. No pertinent family history. Social History:  Social History   Substance and Sexual Activity  Alcohol Use Yes   Comment: Occ.     Social History   Substance and Sexual Activity  Drug Use Yes   Types: Amphetamines, Marijuana    Social History   Socioeconomic History   Marital status: Single    Spouse name: Not on file   Number of children: Not on file   Years of education: Not on file   Highest education level: Not on file  Occupational History   Not on file  Tobacco Use   Smoking status: Former    Current packs/day: 0.00    Types: Cigarettes    Quit date: 06/13/2009    Years since quitting: 14.9   Smokeless tobacco: Not on file  Substance and Sexual Activity   Alcohol use: Yes    Comment: Occ.   Drug use: Yes    Types: Amphetamines, Marijuana   Sexual activity: Yes    Birth control/protection: None  Other Topics Concern   Not on file  Social History Narrative   Daily Caffeine   [employer] student   Quit tobacco 12 /10   Lives near campus               Social Drivers of Health   Financial Resource Strain: Not on file  Food Insecurity: No Food Insecurity (05/05/2024)   Hunger Vital Sign    Worried About Running Out of Food in the Last Year: Never true    Ran Out of Food in the Last Year: Never true  Transportation Needs: No Transportation Needs (05/05/2024)   PRAPARE - Administrator, Civil Service (Medical): No    Lack of Transportation (Non-Medical): No  Physical Activity: Not on file  Stress: Not on file  Social Connections: Moderately Isolated (05/05/2024)   Social Connection and Isolation Panel    Frequency of  Communication with Friends and Family: Three times a week    Frequency of Social Gatherings with Friends and Family: Twice a week    Attends Religious Services: Never    Database Administrator or Organizations: No    Attends Engineer, Structural: Never    Marital Status: Living with partner   Past Medical History:  Past Medical History:  Diagnosis Date   Acne    hx accutane use    ADHD (attention deficit hyperactivity disorder)    Depression    Frostbite    toes   H/O: substance abuse (HCC)    Thyromegaly    History reviewed. No pertinent surgical history.  Current Medications: Current Facility-Administered Medications  Medication Dose Route Frequency Provider Last Rate Last Admin   acetaminophen  (TYLENOL ) tablet 650 mg  650 mg Oral Q6H PRN McLauchlin, Angela, NP       alum & mag hydroxide-simeth (MAALOX/MYLANTA) 200-200-20 MG/5ML suspension 30 mL  30 mL Oral Q4H PRN McLauchlin, Angela, NP       dexmethylphenidate (FOCALIN XR) 24 hr capsule 20 mg  20 mg Oral Daily McLauchlin, Angela, NP       Dextromethorphan-buPROPion ER 45-105 MG TBCR 1 tablet  1 tablet Oral BID McLauchlin, Angela, NP   1 tablet at 05/08/24 1300   hydrOXYzine (ATARAX) tablet 25 mg  25 mg Oral TID PRN McLauchlin, Jon, NP   25 mg at 05/07/24 2102   traZODone (DESYREL) tablet 50 mg  50 mg Oral QHS PRN Ajibola, Ene A, NP   50 mg at 05/07/24 2102    Lab Results:  Results for orders placed or performed during the hospital encounter of 05/04/24 (from the past 48 hours)  Lipid panel     Status: Abnormal   Collection Time: 05/08/24  8:09 AM  Result Value Ref Range   Cholesterol 145 0 - 200 mg/dL   Triglycerides 84 <849 mg/dL   HDL 37 (L) >59 mg/dL   Total CHOL/HDL Ratio 3.9 RATIO   VLDL 17 0 - 40 mg/dL   LDL Cholesterol 91 0 - 99 mg/dL    Comment:        Total Cholesterol/HDL:CHD Risk Coronary Heart Disease Risk Table                     Men   Women  1/2 Average Risk   3.4   3.3  Average Risk        5.0   4.4  2 X Average Risk   9.6   7.1  3 X Average Risk  23.4   11.0        Use the calculated Patient Ratio above and the CHD Risk Table to determine the patient's CHD Risk.        ATP III CLASSIFICATION (LDL):  <100     mg/dL   Optimal  899-870  mg/dL   Near or Above  Optimal  130-159  mg/dL   Borderline  839-810  mg/dL   High  >809     mg/dL   Very High Performed at [facility], [address] [place], [place] [address]     Blood Alcohol level:  No results found for: [facility]  Metabolic Disorder Labs: Lab Results  Component Value Date   HGBA1C 4.5 (L) 05/04/2024   MPG 82.45 05/04/2024   No results found for: PROLACTIN Lab Results  Component Value Date   CHOL 145 05/08/2024   TRIG 84 05/08/2024   HDL 37 (L) 05/08/2024   CHOLHDL 3.9 05/08/2024   VLDL 17 05/08/2024   LDLCALC 91 05/08/2024   LDLCALC 67 10/12/2012      Psychiatric Specialty Exam:  Presentation  General Appearance:  Appropriate for Environment  Eye Contact: Fair  Speech: Clear and Coherent; Normal Rate  Speech Volume: Normal    Mood and Affect  Mood: Dysphoric  Affect: Congruent   Thought Process  Thought Processes: Coherent; Goal Directed; Linear  Orientation:Full (Time, Place and Person)  Thought Content:Logical; WDL  Hallucinations:Hallucinations: None  Ideas of Reference:None  Suicidal Thoughts:Suicidal Thoughts: No  Homicidal Thoughts:Homicidal Thoughts: No   Sensorium  Memory: Immediate Fair; Recent Fair; Remote Fair  Judgment: Fair  Insight: Fair   Art Therapist  Concentration: Fair  Attention Span: Fair  Recall: [employer] of Knowledge: Fair  Language: Fair   Psychomotor Activity  Psychomotor Activity: Psychomotor Activity: Normal  Musculoskeletal: Strength & Muscle Tone: within normal limits Gait & Station: normal Assets  Assets: Manufacturing Systems Engineer; Desire for Improvement; Social  Support; Housing    Physical Exam: Physical Exam Vitals and nursing note reviewed.  Constitutional:      Appearance: Normal appearance.  Cardiovascular:     Rate and Rhythm: Normal rate.  Pulmonary:     Effort: Pulmonary effort is normal.  Neurological:     Mental Status: She is alert and oriented to person, place, and time.  Psychiatric:        Mood and Affect: Mood normal.        Behavior: Behavior normal.        Thought Content: Thought content normal.        Judgment: Judgment normal.    Review of Systems  Respiratory:  Negative for shortness of breath.   Cardiovascular:  Negative for chest pain.  Gastrointestinal:  Negative for nausea and vomiting.  Psychiatric/Behavioral:  Negative for hallucinations and suicidal ideas. The patient is nervous/anxious.   All other systems reviewed and are negative.  Blood pressure 101/76, pulse 79, temperature 97.9 F (36.6 C), temperature source Oral, resp. rate 17, height 5' 5 (1.651 m), weight 64 kg, SpO2 100%. Body mass index is 23.46 kg/m.  Diagnosis: Principal Problem:   Major depressive disorder, recurrent, severe without psychotic features (HCC)   PLAN: Safety and Monitoring:  -- Voluntary admission to inpatient psychiatric unit for safety, stabilization and treatment  -- Daily contact with patient to assess and evaluate symptoms and progress in treatment  -- Patient's case to be discussed in multi-disciplinary team meeting  -- Observation Level : q15 minute checks  -- Vital signs:  q12 hours  -- Precautions: suicide, elopement, and assault -- Encouraged patient to participate in unit milieu and in scheduled group therapies  2. Psychiatric Treatment:  Scheduled Medications: Auvelity daily Hydroxyzine as needed (increasing to 50 mg PRN) Resume Focalin after discussing benefits of treatment with patient   -- The risks/benefits/side-effects/alternatives to this medication were  discussed in detail with the patient and  time was given for questions. The patient consents to medication trial.   3. Medical Issues Being Addressed:  No acute medical concerns at this time.    4. Discharge Planning:   -- Social work and case management to assist with discharge planning and identification of hospital follow-up needs prior to discharge  -- Estimated LOS: 5-7 days  Rodney Yera, NP 05/08/2024, 1:23 PM

## 2024-05-08 NOTE — Group Note (Signed)
 Date:  05/08/2024 Time:  9:56 PM  Group Topic/Focus:  Making Healthy Choices:   The focus of this group is to help patients identify negative/unhealthy choices they were using prior to admission and identify positive/healthier coping strategies to replace them upon discharge. Wrap-Up Group:   The focus of this group is to help patients review their daily goal of treatment and discuss progress on daily workbooks.    Participation Level:  Active  Participation Quality:  Appropriate and Attentive  Affect:  Appropriate  Cognitive:  Alert, Appropriate, and Oriented  Insight: Appropriate and Good  Engagement in Group:  Engaged  Modes of Intervention:  Discussion and Support  Additional Comments:  N/A  Butler LITTIE Gelineau 05/08/2024, 9:56 PM

## 2024-05-08 NOTE — Progress Notes (Signed)
 During med pass patient denied SI but did endorse self harm ideation and had superficial scratches on her left wrist. She stated her nails were filed down now.  Pt stated she is only supposed to take her focalin PRN, she preferred to go back to bed and rest and then potentially take it later.  Pt denied any pain or physical complaints.    05/08/24 1000  Psych Admission Type (Psych Patients Only)  Admission Status Voluntary  Psychosocial Assessment  Patient Complaints Anxiety  Eye Contact Fair  Facial Expression Anxious  Affect Sad  Speech Soft  Interaction Minimal  Motor Activity Slow  Appearance/Hygiene Unremarkable  Behavior Characteristics Cooperative  Mood Depressed  Thought Process  Coherency WDL  Content WDL  Delusions None reported or observed  Perception WDL  Hallucination None reported or observed  Judgment Impaired  Confusion None  Danger to Self  Current suicidal ideation? Denies  Self-Injurious Behavior Some self-injurious ideation observed or expressed.  No lethal plan expressed   Agreement Not to Harm Self Yes  Description of Agreement verbal  Danger to Others  Danger to Others None reported or observed

## 2024-05-08 NOTE — BHH Counselor (Signed)
 CSW touched base with patient to engage in safe discharge planning and patient reported that she would like to attend residential treatment with [facility].   CSW touched base with [place] on patient's behalf to move forward with referral.   CSW to continue to assess.   Riely Baskett, MSW, LCSWA 05/08/2024 1:46 PM

## 2024-05-09 NOTE — Group Note (Signed)
 Date:  05/09/2024 Time:  4:06 PM  Group Topic/Focus:  Overcoming Stress:   The focus of this group is to define stress and help patients assess their triggers.    Participation Level:  Did Not Attend   Jacqueline Griffith 05/09/2024, 4:06 PM

## 2024-05-09 NOTE — Group Note (Signed)
 [facility] LCSW Group Therapy Note   Group Date: 05/09/2024 Start Time: 1245 End Time: 1352   Type of Therapy/Topic:  Group Therapy:  Balance in Life  Participation Level:  Active   Description of Group:    This group will address the concept of balance and how it feels and looks when one is unbalanced. Patients will be encouraged to process areas in their lives that are out of balance, and identify reasons for remaining unbalanced. Facilitators will guide patients utilizing problem- solving interventions to address and correct the stressor making their life unbalanced. Understanding and applying boundaries will be explored and addressed for obtaining  and maintaining a balanced life. Patients will be encouraged to explore ways to assertively make their unbalanced needs known to significant others in their lives, using other group members and facilitator for support and feedback.  Therapeutic Goals: Patient will identify two or more emotions or situations they have that consume much of in their lives. Patient will identify signs/triggers that life has become out of balance:  Patient will identify two ways to set boundaries in order to achieve balance in their lives:  Patient will demonstrate ability to communicate their needs through discussion and/or role plays  Summary of Patient Progress: During group, the patient explored how understanding their methods of giving and receiving love can influence social relationships utilizing an emotionally focused therapeutic Lense. Group members discussed times when they felt disconnected and identified strategies to communicate their emotional needs more effectively, fostering stronger interpersonal connections. Together, the patient and peers engaged in brainstorming ways to balance meeting the needs of others while maintaining personal boundaries and self-care. The patient was open to feedback, actively participated in discussions, and contributed  meaningfully to the overall group dynamic.     Therapeutic Modalities:   Cognitive Behavioral Therapy Solution-Focused Therapy Assertiveness Training   Jacqueline CHRISTELLA Kerns, LCSW

## 2024-05-09 NOTE — Group Note (Signed)
 Date:  05/09/2024 Time:  10:39 AM  Group Topic/Focus:  Healthy Communication:   The focus of this group is to discuss communication, barriers to communication, as well as healthy ways to communicate with others.    Participation Level:  Did Not Attend   Jacqueline Griffith 05/09/2024, 10:39 AM

## 2024-05-09 NOTE — Progress Notes (Signed)
   05/09/24 0900  Psych Admission Type (Psych Patients Only)  Admission Status Voluntary  Psychosocial Assessment  Patient Complaints Anxiety (4/10)  Eye Contact Fair  Facial Expression Anxious  Affect Blunted  Speech Soft  Interaction Superficial  Motor Activity Other (Comment) (WNL)  Appearance/Hygiene Unremarkable  Behavior Characteristics Cooperative  Mood Pleasant  Thought Process  Coherency WDL  Content WDL  Delusions None reported or observed  Perception WDL  Hallucination None reported or observed  Judgment Impaired  Confusion None  Danger to Self  Current suicidal ideation? Denies  Agreement Not to Harm Self Yes  Description of Agreement Verbal

## 2024-05-09 NOTE — Plan of Care (Signed)
  Problem: Education: Goal: Knowledge of [facility] Education information/materials will improve Outcome: Progressing   Problem: Education: Goal: Emotional status will improve Outcome: Progressing   Problem: Education: Goal: Mental status will improve Outcome: Progressing   

## 2024-05-09 NOTE — Group Note (Signed)
 Date:  05/09/2024 Time:  8:50 PM  Group Topic/Focus:  Wrap-Up Group:   The focus of this group is to help patients review their daily goal of treatment and discuss progress on daily workbooks.    Participation Level:  Active  Participation Quality:  Appropriate and Attentive  Affect:  Appropriate  Cognitive:  Alert and Appropriate  Insight: Appropriate and Good  Engagement in Group:  Engaged  Modes of Intervention:  Activity  Additional Comments:     Arlester CHRISTELLA Servant 05/09/2024, 8:50 PM

## 2024-05-09 NOTE — BHH Counselor (Signed)
 Patient was accepted to [facility] residential program.   This has been communicated to patient and team.   CSW to continue to assess.   Laurent Cargile, MSW, LCSWA 05/09/2024 4:28 PM

## 2024-05-09 NOTE — Progress Notes (Signed)
   05/09/24 2000  Psychosocial Assessment  Patient Complaints Anxiety  Eye Contact Fair  Facial Expression Anxious  Affect Blunted  Speech Soft  Interaction Superficial  Motor Activity Other (Comment) (WNL)  Appearance/Hygiene Unremarkable  Behavior Characteristics Cooperative  Mood Pleasant  Thought Process  Coherency WDL  Content WDL  Delusions None reported or observed  Perception WDL  Hallucination None reported or observed  Judgment Impaired  Confusion None  Danger to Self  Current suicidal ideation? Denies  Agreement Not to Harm Self Yes  Description of Agreement verbal  Danger to Others  Danger to Others None reported or observed

## 2024-05-09 NOTE — Plan of Care (Signed)
   Problem: Education: Goal: Emotional status will improve Outcome: Progressing

## 2024-05-09 NOTE — Group Note (Signed)
 Recreation Therapy Group Note   Group Topic:Healthy Support Systems  Group Date: 05/09/2024 Start Time: 1030 End Time: 1130 Facilitators: Celestia Jeoffrey BRAVO, LRT, CTRS Location: Craft Room  Group Description: Straw Bridge. In groups or individually, patients were given 10 plastic drinking straws and an equal length of masking tape. Using the materials provided, patients were instructed to build a free-standing bridge-like structure to suspend an everyday item (ex: deck of cards) off the floor or table surface. All materials were required to be used in secondary school teacher. LRT facilitated post-activity discussion reviewing the importance of having strong and healthy support systems in our lives. LRT discussed how the people in our lives serve as the tape and the deck of cards we placed on top of our straw structure are the stressors we face in daily life. LRT and pts discussed what happens in our life when things get too heavy for us , and we don't have strong supports outside of the hospital. Pt shared 2 of their healthy supports in their life aloud in the group.   Goal Area(s) Addressed:  Patient will identify 2 healthy supports in their life. Patient will identify skills to successfully complete activity. Patient will identify correlation of this activity to life post-discharge.  Patient will build on frustration tolerance skills. Patient will increase team building and communication skills.    Affect/Mood: Appropriate   Participation Level: Active and Engaged   Participation Quality: Independent   Behavior: Calm and Cooperative   Speech/Thought Process: Coherent   Insight: Good and Improved   Judgement: Good and Improved   Modes of Intervention: STEM Activity   Patient Response to Interventions:  Attentive, Engaged, and Receptive   Education Outcome:  Acknowledges education   Clinical Observations/Individualized Feedback: Jacqueline Griffith was active in their participation of session activities  and group discussion. Pt identified reading quotes and family as healthy supports.    Plan: Continue to engage patient in RT group sessions 2-3x/week.   Jeoffrey BRAVO Celestia, LRT, CTRS 05/09/2024 1:19 PM

## 2024-05-09 NOTE — Progress Notes (Signed)
 [facility] MD Progress Note  05/09/2024 12:58 PM Berdell Nevitt  MRN:  [id number]   Subjective:  Chart reviewed, case discussed in multidisciplinary meeting, patient seen during rounds.   Upon today's interview patient is in her room reading. She presents as dysphoric and tearful. Patient reports that she had a rough phone call last night with her partner. She expresses how she feels like she is communicating and he is not understanding her communicating.   Discussed historical diagnosis of Borderline Personality Disorder. Discussed etiology and recommended treatments. Patient was able to identify how emotional dysregulation was impacting her ability to communicate with her partner. She expressed her previous stay at  [facility] helped her uncover some previous trauma in their relationship that she did not realize was still impacting her. Patient identified she would want to go back to [facility] for treatment. She currently has a therapist through [facility] who she has met with two times. Discussed that therapy is the first line treatment and central focus for this diagnosis.   Discussed ADHD and her taking her Focalin PRN. Discussed how ADHD can also aid in emotional dysregulation, especially if not treated consistently. Patient reports she has been working with her provider and they recently switched her to Focalin due to Vyvanse causing her to not sleep and not eat. Through discussion, patient reported she will take the medication instead to see if this also helps her. Medications will be resumed tomorrow.   Patient denies SI, HI, and AVH. She identifies still having thoughts to self harm, but not wanting to end her life. Identifies she uses this when too emotionally dysregulated/overwhelmed. Discussed her alternative coping skills and utilizing PRN hydroxyzine.    10/25: Upon assessment patient presents appropriate. She reports feeling like her medication is effective. She denies SI, HI, or  AVH. Reports intermittent thoughts to harm herself but denies any recent harm. Identifies various coping skills such as placing face in cold water or breathing techniques. She is seen throughout the day being active in the milieu.   05/09/2024: Patient seen today on rounds.  Prior to this provider rounding on patient, patient was noted to be on the phone with a residential facility to talk about potential placement postdischarge from the inpatient unit.  Patient reported that the phone call went well, but she reported she will have to wait to hear back from this facility.  Today she reported feeling good.  She reported liking the medication change of taking her Focalin daily.  She reported tolerating medications well at this time.  She denied any appetite changes currently.  She reported initially trouble getting to sleep, but when she went to sleep she reported sleeping well.  She reported talking with her partner today, and reported it went a lot better.  She reported that her partner planned to come see her today during visiting hours.  She denied any suicidal or homicidal thoughts currently.  She denied any auditory or visual hallucinations.   Sleep: Good  Appetite:  Good  Past Psychiatric History: see h&P Family History: History reviewed. No pertinent family history. Social History:  Social History   Substance and Sexual Activity  Alcohol Use Yes   Comment: Occ.     Social History   Substance and Sexual Activity  Drug Use Yes   Types: Amphetamines, Marijuana    Social History   Socioeconomic History   Marital status: Single    Spouse name: Not on file   Number of children: Not on file  Years of education: Not on file   Highest education level: Not on file  Occupational History   Not on file  Tobacco Use   Smoking status: Former    Current packs/day: 0.00    Types: Cigarettes    Quit date: 06/13/2009    Years since quitting: 14.9   Smokeless tobacco: Not on file   Substance and Sexual Activity   Alcohol use: Yes    Comment: Occ.   Drug use: Yes    Types: Amphetamines, Marijuana   Sexual activity: Yes    Birth control/protection: None  Other Topics Concern   Not on file  Social History Narrative   Daily Caffeine   [employer] student   Quit tobacco 12 /10   Lives near campus               Social Drivers of Health   Financial Resource Strain: Not on file  Food Insecurity: No Food Insecurity (05/05/2024)   Hunger Vital Sign    Worried About Running Out of Food in the Last Year: Never true    Ran Out of Food in the Last Year: Never true  Transportation Needs: No Transportation Needs (05/05/2024)   PRAPARE - Administrator, Civil Service (Medical): No    Lack of Transportation (Non-Medical): No  Physical Activity: Not on file  Stress: Not on file  Social Connections: Moderately Isolated (05/05/2024)   Social Connection and Isolation Panel    Frequency of Communication with Friends and Family: Three times a week    Frequency of Social Gatherings with Friends and Family: Twice a week    Attends Religious Services: Never    Database Administrator or Organizations: No    Attends Engineer, Structural: Never    Marital Status: Living with partner   Past Medical History:  Past Medical History:  Diagnosis Date   Acne    hx accutane use    ADHD (attention deficit hyperactivity disorder)    Depression    Frostbite    toes   H/O: substance abuse (HCC)    Thyromegaly    History reviewed. No pertinent surgical history.  Current Medications: Current Facility-Administered Medications  Medication Dose Route Frequency Provider Last Rate Last Admin   acetaminophen  (TYLENOL ) tablet 650 mg  650 mg Oral Q6H PRN McLauchlin, Angela, NP       alum & mag hydroxide-simeth (MAALOX/MYLANTA) 200-200-20 MG/5ML suspension 30 mL  30 mL Oral Q4H PRN McLauchlin, Angela, NP       dexmethylphenidate (FOCALIN XR) 24 hr capsule 20 mg  20 mg Oral  Daily McLauchlin, Angela, NP   20 mg at 05/09/24 0853   Dextromethorphan-buPROPion ER 45-105 MG TBCR 1 tablet  1 tablet Oral BID McLauchlin, Angela, NP   1 tablet at 05/09/24 0852   hydrOXYzine (ATARAX) tablet 50 mg  50 mg Oral TID PRN May, Tanya, NP   50 mg at 05/09/24 0855   traZODone (DESYREL) tablet 50 mg  50 mg Oral QHS PRN Ajibola, Ene A, NP   50 mg at 05/08/24 2156    Lab Results:  Results for orders placed or performed during the hospital encounter of 05/04/24 (from the past 48 hours)  Lipid panel     Status: Abnormal   Collection Time: 05/08/24  8:09 AM  Result Value Ref Range   Cholesterol 145 0 - 200 mg/dL   Triglycerides 84 <849 mg/dL   HDL 37 (L) >59 mg/dL   Total CHOL/HDL Ratio  3.9 RATIO   VLDL 17 0 - 40 mg/dL   LDL Cholesterol 91 0 - 99 mg/dL    Comment:        Total Cholesterol/HDL:CHD Risk Coronary Heart Disease Risk Table                     Men   Women  1/2 Average Risk   3.4   3.3  Average Risk       5.0   4.4  2 X Average Risk   9.6   7.1  3 X Average Risk  23.4   11.0        Use the calculated Patient Ratio above and the CHD Risk Table to determine the patient's CHD Risk.        ATP III CLASSIFICATION (LDL):  <100     mg/dL   Optimal  899-870  mg/dL   Near or Above                    Optimal  130-159  mg/dL   Borderline  839-810  mg/dL   High  >809     mg/dL   Very High Performed at [facility], [address] [place], [place] [address]     Blood Alcohol level:  No results found for: [facility]  Metabolic Disorder Labs: Lab Results  Component Value Date   HGBA1C 4.5 (L) 05/04/2024   MPG 82.45 05/04/2024   No results found for: PROLACTIN Lab Results  Component Value Date   CHOL 145 05/08/2024   TRIG 84 05/08/2024   HDL 37 (L) 05/08/2024   CHOLHDL 3.9 05/08/2024   VLDL 17 05/08/2024   LDLCALC 91 05/08/2024   LDLCALC 67 10/12/2012      Psychiatric Specialty Exam:  Presentation  General Appearance:  Appropriate for  Environment  Eye Contact: Fair  Speech: Clear and Coherent; Normal Rate  Speech Volume: Normal    Mood and Affect  Mood: Dysphoric  Affect: Congruent   Thought Process  Thought Processes: Coherent; Goal Directed; Linear  Orientation:Full (Time, Place and Person)  Thought Content:Logical; WDL  Hallucinations:Hallucinations: None  Ideas of Reference:None  Suicidal Thoughts:Suicidal Thoughts: No  Homicidal Thoughts:Homicidal Thoughts: No   Sensorium  Memory: Immediate Fair; Recent Fair; Remote Fair  Judgment: Fair  Insight: Fair   Art Therapist  Concentration: Fair  Attention Span: Fair  Recall: [employer] of Knowledge: Fair  Language: Fair   Psychomotor Activity  Psychomotor Activity: Psychomotor Activity: Normal  Musculoskeletal: Strength & Muscle Tone: within normal limits Gait & Station: normal Assets  Assets: Manufacturing Systems Engineer; Desire for Improvement; Social Support; Housing    Physical Exam: Physical Exam Vitals and nursing note reviewed.  Constitutional:      Appearance: Normal appearance.  Pulmonary:     Effort: Pulmonary effort is normal.  Neurological:     Mental Status: She is alert and oriented to person, place, and time.    Review of Systems  Respiratory:  Negative for shortness of breath.   Cardiovascular:  Negative for chest pain.  Gastrointestinal:  Negative for nausea and vomiting.  Psychiatric/Behavioral:  Negative for hallucinations and suicidal ideas. The patient is nervous/anxious.   All other systems reviewed and are negative.  Blood pressure 99/72, pulse 84, temperature 98.6 F (37 C), temperature source Oral, resp. rate 17, height 5' 5 (1.651 m), weight 64 kg, SpO2 99%. Body mass index is 23.46 kg/m.  Diagnosis: Principal Problem:   Major depressive  disorder, recurrent, severe without psychotic features (HCC)   PLAN: Safety and Monitoring:  -- Voluntary admission to inpatient  psychiatric unit for safety, stabilization and treatment  -- Daily contact with patient to assess and evaluate symptoms and progress in treatment  -- Patient's case to be discussed in multi-disciplinary team meeting  -- Observation Level : q15 minute checks  -- Vital signs:  q12 hours  -- Precautions: suicide, elopement, and assault -- Encouraged patient to participate in unit milieu and in scheduled group therapies  2. Psychiatric Treatment:  Scheduled Medications: [place] daily Hydroxyzine as needed (increasing to 50 mg PRN) Resume Focalin after discussing benefits of treatment with patient   -- The risks/benefits/side-effects/alternatives to this medication were discussed in detail with the patient and time was given for questions. The patient consents to medication trial.   3. Medical Issues Being Addressed:  No acute medical concerns at this time.    4. Discharge Planning:   -- Social work and case management to assist with discharge planning and identification of hospital follow-up needs prior to discharge  -- Estimated LOS: 5-7 days Zelda Sharps, NP

## 2024-05-09 NOTE — BHH Counselor (Signed)
 CSW touched base with Judyth Littles on patient's behalf.   Admissions coordinator, Annalee set up phone interview with patient for today 05/09/24 at noon to be considered for residential treatment.   This has been communicated to patient and team.   CSW to continue to assess.   Taiyo Kozma, MSW, LCSWA 05/09/2024 11:09 AM

## 2024-05-09 NOTE — Progress Notes (Signed)
   05/08/24 2000  Psych Admission Type (Psych Patients Only)  Admission Status Voluntary  Psychosocial Assessment  Patient Complaints Anxiety  Eye Contact Fair  Facial Expression Anxious  Affect Blunted  Speech Soft;Slow  Interaction Assertive;Minimal  Motor Activity Slow  Appearance/Hygiene Improved  Behavior Characteristics Cooperative;Appropriate to situation  Mood Pleasant;Anxious  Thought Process  Coherency WDL  Content WDL  Delusions None reported or observed  Perception WDL  Hallucination None reported or observed  Judgment Impaired  Confusion None  Danger to Self  Current suicidal ideation? Denies  Self-Injurious Behavior No self-injurious ideation or behavior indicators observed or expressed   Agreement Not to Harm Self Yes  Description of Agreement verbal  Danger to Others  Danger to Others None reported or observed   Mo distress noted denies SI/HI/AVH, thoughts are organized, 15 minutes safety checks maintained

## 2024-05-10 NOTE — Group Note (Signed)
 Date:  05/10/2024 Time:  9:26 PM  Group Topic/Focus:  Wrap-Up Group:   The focus of this group is to help patients review their daily goal of treatment and discuss progress on daily workbooks.    Participation Level:  Active  Participation Quality:  Appropriate  Affect:  Appropriate  Cognitive:  Appropriate  Insight: Good  Engagement in Group:  Engaged  Modes of Intervention:  Socialization  Additional Comments:    Kristen VEAR Gibbon 05/10/2024, 9:26 PM

## 2024-05-10 NOTE — Progress Notes (Signed)
   05/10/24 0900  Psych Admission Type (Psych Patients Only)  Admission Status Voluntary  Psychosocial Assessment  Patient Complaints Anxiety ( anxious about discharge tomorow.)  Eye Contact Fair  Facial Expression Other (Comment) (WNL)  Affect Appropriate to circumstance  Speech Soft  Interaction Superficial  Motor Activity Other (Comment) (WNL)  Appearance/Hygiene Unremarkable  Behavior Characteristics Cooperative  Mood Pleasant  Thought Process  Coherency WDL  Content WDL  Delusions None reported or observed  Perception WDL  Hallucination None reported or observed  Judgment Impaired  Confusion None  Danger to Self  Current suicidal ideation? Denies  Agreement Not to Harm Self Yes  Description of Agreement verbal

## 2024-05-10 NOTE — Group Note (Signed)
 [facility] LCSW Group Therapy Note   Group Date: 05/10/2024 Start Time: 1315 End Time: 1415  Type of Therapy/Topic:  Group Therapy:  Feelings about Diagnosis  Participation Level:  Active   Description of Group:    This group will allow patients to explore their thoughts and feelings about diagnoses they have received. Patients will be guided to explore their level of understanding and acceptance of these diagnoses. Facilitator will encourage patients to process their thoughts and feelings about the reactions of others to their diagnosis, and will guide patients in identifying ways to discuss their diagnosis with significant others in their lives. This group will be process-oriented, with patients participating in exploration of their own experiences as well as giving and receiving support and challenge from other group members.   Therapeutic Goals: 1. Patient will demonstrate understanding of diagnosis as evidence by identifying two or more symptoms of the disorder:  2. Patient will be able to express two feelings regarding the diagnosis 3. Patient will demonstrate ability to communicate their needs through discussion and/or role plays  Summary of Patient Progress: Patient was present in group. Patient was an active participant.  Patient displayed fair insight.  Patient was appropriate.  Patient was supportive of others.    Therapeutic Modalities:   Cognitive Behavioral Therapy Brief Therapy Feelings Identification    Sherryle JINNY Margo, LCSW

## 2024-05-10 NOTE — BHH Counselor (Signed)
 Finanical documents sent to CSW via email from [facility] for patient to complete.   CSW provided patient with these documents.   CSW to continue to assess.   Darelle Kings, MSW, LCSWA 05/10/2024 9:58 AM

## 2024-05-10 NOTE — Plan of Care (Signed)
  Problem: Education: Goal: Mental status will improve Outcome: Progressing Goal: Verbalization of understanding the information provided will improve Outcome: Progressing   Problem: Activity: Goal: Interest or engagement in activities will improve Outcome: Progressing   Problem: Coping: Goal: Ability to verbalize frustrations and anger appropriately will improve Outcome: Progressing   Problem: Health Behavior/Discharge Planning: Goal: Identification of resources available to assist in meeting health care needs will improve Outcome: Progressing

## 2024-05-10 NOTE — Plan of Care (Signed)
   Problem: Education: Goal: Knowledge of [facility] Education information/materials will improve Outcome: Progressing Goal: Emotional status will improve Outcome: Progressing Goal: Mental status will improve Outcome: Progressing Goal: Verbalization of understanding the information provided will improve Outcome: Progressing

## 2024-05-10 NOTE — Plan of Care (Signed)
   Problem: Safety: Goal: Periods of time without injury will increase Outcome: Progressing
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
## 2024-05-10 NOTE — Progress Notes (Signed)
   05/10/24 2000  Psych Admission Type (Psych Patients Only)  Admission Status Voluntary  Psychosocial Assessment  Patient Complaints Anxiety  Eye Contact Fair  Facial Expression Other (Comment) (wnl)  Affect Appropriate to circumstance  Speech Soft  Interaction Superficial  Motor Activity Other (Comment) (wnl)  Appearance/Hygiene Unremarkable  Behavior Characteristics Cooperative;Appropriate to situation  Mood Pleasant  Thought Process  Coherency WDL  Content WDL  Delusions None reported or observed  Perception WDL  Hallucination None reported or observed  Judgment Impaired  Confusion None  Danger to Self  Current suicidal ideation? Denies  Agreement Not to Harm Self Yes  Description of Agreement verbal  Danger to Others  Danger to Others None reported or observed
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
## 2024-05-11 NOTE — Progress Notes (Signed)
 Patient pleasant and cooperative on approach. Denies SI,HI and AVH. Verbalized understanding discharge instructions,prescriptions and follow up care. All belongings returned from [employer]. Suicide safety plan filled by patient and placed in chart. Copy given to patient.Patient escorted out by staff and transported by cab.

## 2024-05-11 NOTE — Progress Notes (Signed)
  [facility] Adult Case Management Discharge Plan :  Will you be returning to the same living situation after discharge:  Yes,  Patient to return home and then attend treatment at [facility] program.  At discharge, do you have transportation home?: Yes,  Patient's partner to provide transportation.  Do you have the ability to pay for your medications: Yes,  BLUE CROSS BLUE SHIELD / BCBS COMM PPO  Release of information consent forms completed and in the chart;  Patient's signature needed at discharge.  Patient to Follow up at:  Follow-up Information     [facility] Follow up.   Why: In person psychiatry appointment is 06/27/24 at 11:15 AM with Dr. Emilio Aurora, MD. This appointment will be following your residential treatment. Contact information: Phone: [phone] Fax: [phone]  [address] [place], [place] [address]        [facility] Inpatient Follow up.   Why: You will attend residential treatment with Judyth Littles at their [facility] starting 05/12/2024 . Contact information: [address] [place], [place] [address]  Phone: [phone] Fax: [phone]                Next level of care provider has access to [facility] Link:no  Safety Planning and Suicide Prevention discussed: Yes,  [name], [phone], Partner     Has patient been referred to the Quitline?: Patient refused referral for treatment  Patient has been referred for addiction treatment: No known substance use disorder.  Alveta CHRISTELLA Kerns, LCSW 05/11/2024, 9:17 AM

## 2024-05-11 NOTE — Group Note (Signed)
 Date:  05/11/2024 Time:  11:04 AM  Group Topic/Focus:  Icebreaker Group: The focus of the group is to explain the functions and rules of the unit and also allow patients to introduce themselves to each other and name a positive feature about themselves.  Explains the roles and duties of every staff member on the milieu and how the each day will be structured for their care.     Participation Level:  Active  Participation Quality:  Appropriate  Affect:  Appropriate  Cognitive:  Appropriate  Insight: Appropriate  Engagement in Group:  Engaged  Modes of Intervention:  Activity  Additional Comments:    Jacqueline Griffith 05/11/2024, 11:04 AM

## 2024-05-11 NOTE — BHH Suicide Risk Assessment (Signed)
 [facility] Discharge Suicide Risk Assessment   Principal Problem: Major depressive disorder, recurrent, severe without psychotic features (HCC) Discharge Diagnoses: Principal Problem:   Major depressive disorder, recurrent, severe without psychotic features (HCC)   Total Time spent with patient: 30 minutes  Musculoskeletal: Strength & Muscle Tone: within normal limits Gait & Station: normal Patient leans: N/A  Psychiatric Specialty Exam  Presentation  General Appearance:  Appropriate for Environment; Casual  Eye Contact: Fair  Speech: Clear and Coherent  Speech Volume: Normal  Handedness: Right   Mood and Affect  Mood: Euthymic  Duration of Depression Symptoms: Greater than two weeks  Affect: Appropriate   Thought Process  Thought Processes: Coherent  Descriptions of Associations:Intact  Orientation:Full (Time, Place and Person)  Thought Content:Logical  History of Schizophrenia/Schizoaffective disorder:No  Duration of Psychotic Symptoms:No data recorded Hallucinations:Hallucinations: None  Ideas of Reference:None  Suicidal Thoughts:Suicidal Thoughts: No  Homicidal Thoughts:Homicidal Thoughts: No   Sensorium  Memory: Immediate Fair; Recent Fair; Remote Fair  Judgment: Fair  Insight: Fair   Art Therapist  Concentration: Fair  Attention Span: Fair  Recall: [employer] of Knowledge: Fair  Language: Fair   Psychomotor Activity  Psychomotor Activity: Psychomotor Activity: Normal   Assets  Assets: Communication Skills; Desire for Improvement; Resilience   Sleep  Sleep:No data recorded Estimated Sleeping Duration (Last 24 Hours): 3.50-4.25 hours  Physical Exam: Physical Exam ROS Blood pressure 112/78, pulse 76, temperature 98.4 F (36.9 C), temperature source Oral, resp. rate 16, height 5' 5 (1.651 m), weight 64 kg, SpO2 100%. Body mass index is 23.46 kg/m.  Mental Status Per Nursing Assessment::   On  Admission:  Suicidal ideation indicated by patient  Demographic Factors:  Caucasian  Loss Factors: Decrease in vocational status  Historical Factors: Impulsivity  Risk Reduction Factors:   Living with another person, especially a relative, Positive social support, Positive therapeutic relationship, and Positive coping skills or problem solving skills  Continued Clinical Symptoms:  Previous Psychiatric Diagnoses and Treatments  Cognitive Features That Contribute To Risk:  None    Suicide Risk:  Minimal: No identifiable suicidal ideation.  Patients presenting with no risk factors but with morbid ruminations; may be classified as minimal risk based on the severity of the depressive symptoms   Follow-up Information     [facility] Follow up.   Why: In person psychiatry appointment is 06/27/24 at 11:15 AM with Dr. Emilio Aurora, MD. This appointment will be following your residential treatment. Contact information: Phone: [phone] Fax: [phone]  [address] [place], [place] [address]        [facility] Inpatient Follow up.   Why: You will attend residential treatment with Judyth Littles at their [facility] starting 05/12/2024 . Contact information: [address], [place] [address]  Phone: [phone] Fax: [phone]                Plan Of Care/Follow-up recommendations:  Activity:  As tolerated  Allyn Foil, MD 05/11/2024, 9:33 AM

## 2024-05-11 NOTE — Discharge Summary (Signed)
 Physician Discharge Summary Note  Patient:  Jacqueline Griffith is an 33 y.o., female MRN:  [id number] DOB:  06-30-1991 Patient phone:  [phone] (home)  Patient address:   [address] [place] [address],   Total time spent: 40 min Date of Admission:  05/04/2024 Date of Discharge: 05/11/24  Reason for Admission:  Jacqueline Griffith is a 33 year old female who presented to [facility] as a voluntary, unaccompanied walk-in with complaints of uncontrolled anxiety and suicidal ideation. Jacqueline Griffith reports feeling extremely anxious today and acknowledges passive suicidal ideation without a plan or prior suicide attempts. Patient is admitted to adult psych unit with Q15 min safety monitoring. Multidisciplinary team approach is offered. Medication management; group/milieu therapy is offered.   Principal Problem: Major depressive disorder, recurrent, severe without psychotic features [facility]) Discharge Diagnoses: Principal Problem:   Major depressive disorder, recurrent, severe without psychotic features (HCC)   Past Psychiatric History: see h&p  Family Psychiatric  History: see h&p Social History:  Social History   Substance and Sexual Activity  Alcohol Use Yes   Comment: Occ.     Social History   Substance and Sexual Activity  Drug Use Yes   Types: Amphetamines, Marijuana    Social History   Socioeconomic History   Marital status: Single    Spouse name: Not on file   Number of children: Not on file   Years of education: Not on file   Highest education level: Not on file  Occupational History   Not on file  Tobacco Use   Smoking status: Former    Current packs/day: 0.00    Types: Cigarettes    Quit date: 06/13/2009    Years since quitting: 14.9   Smokeless tobacco: Not on file  Substance and Sexual Activity   Alcohol use: Yes    Comment: Occ.   Drug use: Yes    Types: Amphetamines, Marijuana   Sexual activity: Yes    Birth control/protection: None  Other Topics  Concern   Not on file  Social History Narrative   Daily Caffeine   [employer] student   Quit tobacco 12 /10   Lives near campus               Social Drivers of Health   Financial Resource Strain: Not on file  Food Insecurity: No Food Insecurity (05/05/2024)   Hunger Vital Sign    Worried About Running Out of Food in the Last Year: Never true    Ran Out of Food in the Last Year: Never true  Transportation Needs: No Transportation Needs (05/05/2024)   PRAPARE - Administrator, Civil Service (Medical): No    Lack of Transportation (Non-Medical): No  Physical Activity: Not on file  Stress: Not on file  Social Connections: Moderately Isolated (05/05/2024)   Social Connection and Isolation Panel    Frequency of Communication with Friends and Family: Three times a week    Frequency of Social Gatherings with Friends and Family: Twice a week    Attends Religious Services: Never    Database Administrator or Organizations: No    Attends Engineer, Structural: Never    Marital Status: Living with partner   Past Medical History:  Past Medical History:  Diagnosis Date   Acne    hx accutane use    ADHD (attention deficit hyperactivity disorder)    Depression    Frostbite    toes   H/O: substance abuse (HCC)    Thyromegaly  History reviewed. No pertinent surgical history. Family History: History reviewed. No pertinent family history.  Hospital Course:  Jacqueline Griffith is a 33 year old female who presented to [facility] as a voluntary, unaccompanied walk-in with complaints of uncontrolled anxiety and suicidal ideation. Jacqueline Griffith reports feeling extremely anxious today and acknowledges passive suicidal ideation without a plan or prior suicide attempts. Patient is admitted to adult psych unit with Q15 min safety monitoring. Multidisciplinary team approach is offered. Medication management; group/milieu therapy is offered.  Detailed risk assessment is complete based on clinical  exam and individual risk factors and acute suicide risk is low and acute violence risk is low.    On admission, Patient was Continued on  home medications Auvelity, Focalin, hydroxyzine.  Patient tolerated medications very well.  Patient displayed safe behaviors on the unit and consistently denied any active SI.  Patient participated in groups and treatment plan.  On the day of discharge she consistently denied SI/HI/plan and denied auditory/visual hallucinations.  She got accepted into [facility] residential facility in [place] .  Patient was discharged home with the plan to be transitioned to the long-term residential. Currently, all modifiable risk of harm to self/harm to others have been addressed and patient is no longer appropriate for the acute inpatient setting and is able to continue treatment for mental health needs in the community with the supports as indicated below.  Patient is educated and verbalized understanding of discharge plan of care including medications, follow-up appointments, mental health resources and further crisis services in the community.  He is instructed to call 911 or present to the nearest emergency room should he experience any decompensation in mood, disturbance of bowel or return of suicidal/homicidal ideations.  Patient verbalizes understanding of this education and agrees to this plan of care  Physical Findings: AIMS:  , ,  ,  ,    CIWA:    COWS:        Psychiatric Specialty Exam:  Presentation  General Appearance:  Appropriate for Environment; Casual  Eye Contact: Fair  Speech: Clear and Coherent  Speech Volume: Normal    Mood and Affect  Mood: Euthymic  Affect: Appropriate   Thought Process  Thought Processes: Coherent  Descriptions of Associations:Intact  Orientation:Full (Time, Place and Person)  Thought Content:Logical  Hallucinations:Hallucinations: None  Ideas of Reference:None  Suicidal Thoughts:Suicidal Thoughts:  No  Homicidal Thoughts:Homicidal Thoughts: No   Sensorium  Memory: Immediate Fair; Recent Fair; Remote Fair  Judgment: Fair  Insight: Fair   Art Therapist  Concentration: Fair  Attention Span: Fair  Recall: [employer] of Knowledge: Fair  Language: Fair   Psychomotor Activity  Psychomotor Activity: Psychomotor Activity: Normal  Musculoskeletal: Strength & Muscle Tone: within normal limits Gait & Station: normal Assets  Assets: Manufacturing Systems Engineer; Desire for Improvement; Resilience   Sleep  Sleep:No data recorded   Physical Exam: Physical Exam Vitals and nursing note reviewed.    ROS Blood pressure 112/78, pulse 76, temperature 98.4 F (36.9 C), temperature source Oral, resp. rate 16, height 5' 5 (1.651 m), weight 64 kg, SpO2 100%. Body mass index is 23.46 kg/m.   Social History   Tobacco Use  Smoking Status Former   Current packs/day: 0.00   Types: Cigarettes   Quit date: 06/13/2009   Years since quitting: 14.9  Smokeless Tobacco Not on file   Tobacco Cessation:  N/A, patient does not currently use tobacco products   Blood Alcohol level:  No results found for: [facility]  Metabolic Disorder Labs:  Lab  Results  Component Value Date   HGBA1C 4.5 (L) 05/04/2024   MPG 82.45 05/04/2024   No results found for: PROLACTIN Lab Results  Component Value Date   CHOL 145 05/08/2024   TRIG 84 05/08/2024   HDL 37 (L) 05/08/2024   CHOLHDL 3.9 05/08/2024   VLDL 17 05/08/2024   LDLCALC 91 05/08/2024   LDLCALC 67 10/12/2012    See Psychiatric Specialty Exam and Suicide Risk Assessment completed by Attending Physician prior to discharge.  Discharge destination:  Other:  substance use rehab  Is patient on multiple antipsychotic therapies at discharge:  No   Has Patient had three or more failed trials of antipsychotic monotherapy by history:  No  Recommended Plan for Multiple Antipsychotic Therapies: NA   Allergies as of  05/11/2024   No Known Allergies      Medication List     STOP taking these medications    ibuprofen 200 MG tablet Commonly known as: ADVIL   levonorgestrel 19.5 MG IUD Commonly known as: KYLEENA   lisdexamfetamine 20 MG capsule Commonly known as: VYVANSE   MIDOL PO   mirtazapine 15 MG tablet Commonly known as: REMERON   ondansetron  4 MG disintegrating tablet Commonly known as: ZOFRAN -ODT   SALINE NASAL MIST NA   traZODone 50 MG tablet Commonly known as: DESYREL       TAKE these medications      Indication  Auvelity 45-105 MG Tbcr Generic drug: Dextromethorphan-buPROPion ER Take 1 tablet by mouth in the morning and at bedtime.  Indication: Major Depressive Disorder   dexmethylphenidate 20 MG 24 hr capsule Commonly known as: FOCALIN XR Take 20 mg by mouth daily.  Indication: ADHD - Attention Deficit Hyperactivity Disorder   hydrOXYzine 25 MG tablet Commonly known as: ATARAX Take 25 mg by mouth 3 (three) times daily as needed for anxiety.  Indication: Feeling Anxious   MULTIVITAMIN PO Take 1 Dose by mouth daily. Take 1 gummy every day.  Indication: Weight Loss   propranolol 20 MG tablet Commonly known as: INDERAL Take 20 mg by mouth 3 (three) times daily.  Indication: Feeling Anxious   Spravato (56 MG Dose) 28 MG/DEVICE Sopk Generic drug: Esketamine HCl (56 MG Dose) Place 56 mg into the nose 2 (two) times a week.  Indication: Major Depressive Disorder   ZZZQUIL PO Take 1 tablet by mouth at bedtime as needed (Sleep).  Indication: Trouble Sleeping        Follow-up Information     [facility] Follow up.   Why: In person psychiatry appointment is 06/27/24 at 11:15 AM with Dr. Emilio Aurora, MD. This appointment will be following your residential treatment. Contact information: Phone: [phone] Fax: [phone]  [address] [place], [place] [address]        [facility] Follow up.   Why: You will  attend residential treatment with Judyth Littles at their [facility] starting 05/12/2024 . Contact information: [address], [place] [address]  Phone: [phone] Fax: [phone]                Follow-up recommendations:  Activity:  As tolerated    Signed: Yuuki Skeens, MD 05/11/2024, 9:34 AM

## 2024-07-12 ENCOUNTER — Telehealth (HOSPITAL_COMMUNITY): Payer: Self-pay | Admitting: Licensed Clinical Social Worker

## 2024-07-12 NOTE — Telephone Encounter (Signed)
"  See call log  "

## 2024-07-18 ENCOUNTER — Ambulatory Visit (INDEPENDENT_AMBULATORY_CARE_PROVIDER_SITE_OTHER): Admitting: Licensed Clinical Social Worker

## 2024-07-18 DIAGNOSIS — F329 Major depressive disorder, single episode, unspecified: Secondary | ICD-10-CM | POA: Diagnosis not present

## 2024-07-18 DIAGNOSIS — F332 Major depressive disorder, recurrent severe without psychotic features: Secondary | ICD-10-CM

## 2024-07-18 DIAGNOSIS — F411 Generalized anxiety disorder: Secondary | ICD-10-CM

## 2024-07-18 DIAGNOSIS — F603 Borderline personality disorder: Secondary | ICD-10-CM

## 2024-07-20 ENCOUNTER — Encounter (HOSPITAL_COMMUNITY): Payer: Self-pay

## 2024-07-20 NOTE — Psych (Signed)
 Active     OP Depression     LTG: Reduce frequency, intensity, and duration of depression symptoms so that daily functioning is improved     Start:  07/21/24    Expected End:  09/18/24         LTG: Increase coping skills to manage depression and improve ability to perform daily activities     Start:  07/21/24    Expected End:  09/18/24         STG: Jacqueline Griffith will attend at least 80% of scheduled PHP sessions        Start:  07/21/24    Expected End:  09/18/24         STG: Jacqueline Griffith will identify cognitive patterns and beliefs that support depression     Start:  07/21/24    Expected End:  09/18/24         Therapist will educate patient on cognitive distortions and the rationale for treatment of depression     Start:  07/21/24         Therapist will review PLEASE Skills (Treat Physical Illness, Balance Eating, Avoid Mood-Altering Substances, Balance Sleep and Get Exercise) with patient     Start:  07/21/24

## 2024-07-20 NOTE — Psych (Signed)
 Virtual Visit via Video Note  I connected with Jacqueline Griffith on 07/18/2024 at  1:00 PM EST by a video enabled telemedicine application and verified that I am speaking with the correct person using two identifiers.  Location: Patient: patient home Provider: clinical home office   I discussed the limitations of evaluation and management by telemedicine and the availability of in person appointments. The patient expressed understanding and agreed to proceed.  I discussed the assessment and treatment plan with the patient. The patient was provided an opportunity to ask questions and all were answered. The patient agreed with the plan and demonstrated an understanding of the instructions.   The patient was advised to call back or seek an in-person evaluation if the symptoms worsen or if the condition fails to improve as anticipated.  I provided 57 minutes of non-face-to-face time during this encounter.   Randall Bastos, LCSW    Comprehensive Clinical Assessment (CCA) Note  07/20/2024 Jacqueline Griffith [id number]  Chief Complaint: No chief complaint on file.  Visit Diagnosis: MDD Severe    CCA Screening, Triage and Referral (STR)  Patient Reported Information How did you hear about us ? Hospital Discharge  Referral name: [facility] in [place]   Referral phone number: No data recorded  Whom do you see for routine medical problems? No data recorded Practice/Facility Name: No data recorded Practice/Facility Phone Number: No data recorded Name of Contact: No data recorded Contact Number: No data recorded Contact Fax Number: No data recorded Prescriber Name: No data recorded Prescriber Address (if known): No data recorded  What Is the Reason for Your Visit/Call Today? Pt is referred to [facility] post-residential treatment for mental health  How Long Has This Been Causing You Problems? > than 6 months  What Do You Feel Would Help You the Most Today? Treatment for  Depression or other mood problem   Have You Recently Been in Any Inpatient Treatment (Hospital/Detox/Crisis Center/28-Day Program)? Yes  Name/Location of Program/Hospital:[facility] - [place]  How Long Were You There? approx 4 weeks  When Were You Discharged? 07/11/24   Have You Ever Received Services From [employer] Before? Yes  Who Do You See at [facility]? crisis services   Have You Recently Had Any Thoughts About Hurting Yourself? Yes  Are You Planning to Commit Suicide/Harm Yourself At This time? No   Have you Recently Had Thoughts About Hurting Someone Sherral? No  Explanation: passive SI and NSSIB   Have You Used Any Alcohol or Drugs in the Past 24 Hours? No  How Long Ago Did You Use Drugs or Alcohol? No data recorded What Did You Use and How Much? No data recorded  Do You Currently Have a Therapist/Psychiatrist? Yes  Name of Therapist/Psychiatrist: Dr Vincente - psychiatry; was seeing Dawn Averso for therapy pre-residential txt, does not want to return   Have You Been Recently Discharged From Any Office Practice or Programs? No  Explanation of Discharge From Practice/Program: [facility] PHP and IOP     CCA Screening Triage Referral Assessment Type of Contact: Tele-Assessment  Is this Initial or Reassessment? No data recorded Date Telepsych consult ordered in [facility]:  No data recorded Time Telepsych consult ordered in [facility]:  No data recorded  Patient Reported Information Reviewed? No data recorded Patient Left Without Being Seen? No data recorded Reason for Not Completing Assessment: No data recorded  Collateral Involvement: EHR   Does Patient Have a Court Appointed Legal Guardian? No data recorded Name and Contact of Legal Guardian:  No data recorded If Minor and Not Living with Parent(s), Who has Custody? adult  Is CPS involved or ever been involved? Never  Is APS involved or ever been involved? Never   Patient Determined To Be At Risk  for Harm To Self or Others Based on Review of Patient Reported Information or Presenting Complaint? No  Method: No Plan  Availability of Means: No access or NA  Intent: Vague intent or NA  Notification Required: No need or identified person  Additional Information for Danger to Others Potential: -- (na)  Additional Comments for Danger to Others Potential: none  Are There Guns or Other Weapons in Your Home? No  Types of Guns/Weapons: na  Are These Weapons Safely Secured?                            No  Who Could Verify You Are Able To Have These Secured: na  Do You Have any Outstanding Charges, Pending Court Dates, Parole/Probation? pt denied  Contacted To Inform of Risk of Harm To Self or Others: -- (na)   Location of Assessment: -- (Pt's home)   Does Patient Present under Involuntary Commitment? No  IVC Papers Initial File Date: No data recorded  [place] of Residence: [place]   Patient Currently Receiving the Following Services: Medication Management   Determination of Need: Routine (7 days)   Options For Referral: Partial Hospitalization     CCA Biopsychosocial Intake/Chief Complaint:  Pt presents as referral to PHP post-discharge from residential [facility] txt at [facility] in [place]. Pt reports she was d/c on 07/11/24 and discharged earlier than planned due to insurance. Pt states she was at the facility since the end of October 2025 and had an inpatient psych admission during her time in [place] as well due to self harm and SI. Pt reports zero attempts, 2 hospitalizations, and 2 residential treatment stays. Pt has a psychiatrist she's been with for over a year and did 5 sessions of therapy prior to residential. Pt reports being diagnosed with MDD, GAD, BPD, and ADD approximately 10 years ago. Pt reports diagnoses of Panic D/O and PTSD have been added more recently. Pt states increasing MH problems since the summer when the ex-wife of pt's partner of 4 years  decided she did not want pt as involved with the children I love as my own. Pt reports issues in her relationship continued and have been a major source of stress. Pt reports her week since discharge has been really rough because the break she and partner were on now seems to be a break-up as he is not willing to see or talk to her except through email. Pt reports stressors or 1)relationship stress 2) sense of self 3) finding a reason to live each day 4) increased passive SI. Pt states she lives alone as of last week, as her partner moved out while pt was in residential. Pt reports limited support from dad/mom/older brother and from (ex?)partner's mother. Pt's protective factors include willingness to engage in treatment, medication compliance, and regular providers. Pt states she has been on Spravato since March 2025, however did not recieve it regularly while in residential. Pt notes medical dx of PCOS. Pt denies AVH and HI. Pt states episodic alcohol and marijuana use, last alcohol use in 10/25 and half a joint on new years. Pt reports self harm history of scracthing herself and currently has superficial red marks along both sides of her  face. Pt reports regular passive SI and denies plan/intent.  Current Symptoms/Problems: depressed mood, mood swings, lack of motivation, feeling like a robot, doing daily tasks so I don't have to lie to my parents about doing them   Patient Reported Schizophrenia/Schizoaffective Diagnosis in Past: No   Strengths: able to ask for and accept help  Preferences: intensive services  Abilities: No data recorded  Type of Services Patient Feels are Needed: No data recorded  Initial Clinical Notes/Concerns: No data recorded  Mental Health Symptoms Depression:  Difficulty Concentrating; Irritability; Increase/decrease in appetite; Hopelessness; Worthlessness   Duration of Depressive symptoms: Greater than two weeks   Mania:  None   Anxiety:   Worrying;  Difficulty concentrating; Irritability; Tension   Psychosis:  None   Duration of Psychotic symptoms: No data recorded  Trauma:  None   Obsessions:  None   Compulsions:  None   Inattention:  N/A   Hyperactivity/Impulsivity:  N/A   Oppositional/Defiant Behaviors:  N/A   Emotional Irregularity:  Potentially harmful impulsivity; Mood lability; Intense/unstable relationships; Frantic efforts to avoid abandonment; Recurrent suicidal behaviors/gestures/threats; Chronic feelings of emptiness; Unstable self-image   Other Mood/Personality Symptoms:  NA    Mental Status Exam Appearance and self-care  Stature:  Average   Weight:  Average weight   Clothing:  Casual   Grooming:  Normal   Cosmetic use:  None   Posture/gait:  Normal   Motor activity:  Not Remarkable   Sensorium  Attention:  Distractible   Concentration:  Scattered   Orientation:  X5   Recall/memory:  Normal   Affect and Mood  Affect:  Tearful; Depressed   Mood:  Depressed   Relating  Eye contact:  Normal   Facial expression:  Depressed   Attitude toward examiner:  Cooperative   Thought and Language  Speech flow: Clear and Coherent ([place])   Thought content:  Appropriate to Mood and Circumstances   Preoccupation:  Ruminations (disagreement with partner)   Hallucinations:  None   Organization:  No data recorded  [employer] of Knowledge:  Average   Intelligence:  Average   Abstraction:  Functional   Judgement:  Fair   Reality Testing:  Adequate   Insight:  Lacking; Gaps; Flashes of insight   Decision Making:  Impulsive; Vacilates   Social Functioning  Social Maturity:  Isolates   Social Judgement:  Normal   Stress  Stressors:  Surveyor, Quantity; Work; Relationship; Grief/losses; Transitions   Coping Ability:  Overwhelmed; Deficient supports   Skill Deficits:  Communication; Decision making; Interpersonal; Self-control   Supports:  Support needed; Family      Religion: Religion/Spirituality Are You A Religious Person?: No How Might This Affect Treatment?: none  Leisure/Recreation: Leisure / Recreation Do You Have Hobbies?: Yes  Exercise/Diet: Exercise/Diet Do You Exercise?: Yes Have You Gained or Lost A Significant Amount of Weight in the Past Six Months?: No Do You Follow a Special Diet?: No Do You Have Any Trouble Sleeping?: No   CCA Employment/Education Employment/Work Situation: Employment / Work Situation Employment Situation: Unemployed Patient's Job has Been Impacted by Current Illness: No What is the Longest Time Patient has Held a Job?: 4 years. Where was the Patient Employed at that Time?: [employer] Has Patient ever Been in the [employer]?: No  Education: Education Is Patient Currently Attending School?: No Did Garment/textile Technologist From [employer]?: Yes Did You Attend College?: Yes Did You Have An Individualized Education Program (IIEP): No Did You Have Any Difficulty At  School?: Yes Were Any Medications Ever Prescribed For These Difficulties?: Yes Medications Prescribed For School Difficulties?: for ADD Patient's Education Has Been Impacted by Current Illness: No   CCA Family/Childhood History Family and Relationship History: Family history Marital status: Long term relationship Long term relationship, how long?: 4 years What types of issues is patient dealing with in the relationship?: pt states they are co-dependent Additional relationship information: pt reports they are on a break, which may have become a break-up, all instigated by partner What is your sexual orientation?: Heterosexual Does patient have children?: No  Childhood History:  Childhood History By whom was/is the patient raised?: Both parents Description of patient's relationship with caregiver when they were a child: Really good. Patient's description of current relationship with people who raised him/her: Pt states parents  live half of the year in [place] and the other in [place]. They are supportive but often absent How were you disciplined when you got in trouble as a child/adolescent?: Spankings. Does patient have siblings?: Yes Number of Siblings: 2 Description of patient's current relationship with siblings: older brother in [place], younger brother in college in [place] - distant yet supportive Did patient suffer any verbal/emotional/physical/sexual abuse as a child?: No Did patient suffer from severe childhood neglect?: No Has patient ever been sexually abused/assaulted/raped as an adolescent or adult?: No Was the patient ever a victim of a crime or a disaster?: No Witnessed domestic violence?: No Has patient been affected by domestic violence as an adult?: No  Child/Adolescent Assessment:     CCA Substance Use Alcohol/Drug Use: Alcohol / Drug Use Pain Medications: pt denies Prescriptions: Vistaril  50mg , PRN 6 hours; Propanalol 10mg  TID PRN; Edwin 18mg  QAM; Zyprexa 2.5mg  PRN every 4 hours; Auvality 45-105mg  BID; Trazadone 50 mg PRN, Spravato 1x/week Over the Counter: pt denies History of alcohol / drug use?: No history of alcohol / drug abuse (Pt reports history of alcohol and cannabis use and denies diagnostic criteria.)       ASAM's:  Six Dimensions of Multidimensional Assessment  Dimension 1:  Acute Intoxication and/or Withdrawal Potential:      Dimension 2:  Biomedical Conditions and Complications:      Dimension 3:  Emotional, Behavioral, or Cognitive Conditions and Complications:     Dimension 4:  Readiness to Change:     Dimension 5:  Relapse, Continued use, or Continued Problem Potential:     Dimension 6:  Recovery/Living Environment:     ASAM Severity Score:    ASAM Recommended Level of Treatment:     Substance use Disorder (SUD)    Recommendations for Services/Supports/Treatments: Recommendations for Services/Supports/Treatments Recommendations For  Services/Supports/Treatments: Partial Hospitalization  DSM5 Diagnoses: Patient Active Problem List   Diagnosis Date Noted   Major depressive disorder, recurrent, severe without psychotic features (HCC) 05/05/2024   Abnormal finding on CT scan 10/12/2012   Stomach problems 10/12/2012   Frequent bowel movements 10/12/2012   Nausea and vomiting in adult 08/26/2011   Sore throat 08/26/2011   ADJUSTMENT DISORDER WITH MIXED FEATURES 12/07/2009   SYNCOPE AND COLLAPSE 09/28/2009   MALAISE AND FATIGUE 08/08/2009   FOOT PAIN, BILATERAL 08/21/2008   THROAT PAIN 05/22/2008   TELOGEN EFFLUVIUM 08/20/2007   Abnormalities of hair 08/20/2007    Patient Centered Plan: Patient is on the following Treatment Plan(s):  Depression   Referrals to Alternative Service(s): Referred to Alternative Service(s):   Place:   Date:   Time:    Referred to Alternative Service(s):   Place:   Date:  Time:    Referred to Alternative Service(s):   Place:   Date:   Time:    Referred to Alternative Service(s):   Place:   Date:   Time:      Collaboration of Care: Other EHR  Patient/Guardian was advised Release of Information must be obtained prior to any record release in order to collaborate their care with an outside provider. Patient/Guardian was advised if they have not already done so to contact the registration department to sign all necessary forms in order for us  to release information regarding their care.   Consent: Patient/Guardian gives verbal consent for treatment and assignment of benefits for services provided during this visit. Patient/Guardian expressed understanding and agreed to proceed.   Randall Bastos, LCSW

## 2024-07-21 ENCOUNTER — Ambulatory Visit (HOSPITAL_COMMUNITY)

## 2024-07-21 ENCOUNTER — Encounter (HOSPITAL_COMMUNITY): Payer: Self-pay

## 2024-07-22 ENCOUNTER — Telehealth (HOSPITAL_COMMUNITY): Payer: Self-pay | Admitting: Licensed Clinical Social Worker

## 2024-07-22 ENCOUNTER — Ambulatory Visit (HOSPITAL_COMMUNITY)

## 2024-07-22 NOTE — Telephone Encounter (Signed)
"  See call log  "

## 2024-07-25 ENCOUNTER — Ambulatory Visit (INDEPENDENT_AMBULATORY_CARE_PROVIDER_SITE_OTHER): Admitting: Licensed Clinical Social Worker

## 2024-07-25 ENCOUNTER — Ambulatory Visit (HOSPITAL_COMMUNITY)

## 2024-07-25 DIAGNOSIS — R4589 Other symptoms and signs involving emotional state: Secondary | ICD-10-CM

## 2024-07-25 DIAGNOSIS — F332 Major depressive disorder, recurrent severe without psychotic features: Secondary | ICD-10-CM

## 2024-07-25 DIAGNOSIS — F603 Borderline personality disorder: Secondary | ICD-10-CM

## 2024-07-25 DIAGNOSIS — F411 Generalized anxiety disorder: Secondary | ICD-10-CM

## 2024-07-25 NOTE — Psych (Signed)
 " [facility] PHP THERAPIST PROGRESS NOTE  Jacqueline Griffith [id number]   Session Time: 9:00 am - 10:00 am  Participation Level: Active  Behavioral Response: CasualAlertAnxious and Depressed  Type of Therapy: Group Therapy  Treatment Goals addressed: Coping  Progress Towards Goals: Initial  Interventions: CBT, DBT, Solution Focused, Strength-based, Supportive, and Reframing  Therapist Response: Clinician led check-in regarding current stressors and situation, and review of patient completed daily inventory. Clinician utilized active listening and empathetic response and validated patient emotions. Clinician facilitated processing group on pertinent issues.?   Summary: Patient arrived within time allowed. Patient rates their depression at a 4 and anxiety at a 3 on a scale of 1-10 with 10 being best. Pt provided pertinent history and stressors and shared details of recent struggles regarding changes within her home, specifically that she is separated from her partner due to mental health struggles. Pt reports undergoing couple's therapy at this time. When asked about sleep and appetite, pt reports they slept 9.5 hours last night and ate 2 meals yesterday. Pt denied experiencing SI/SH thoughts and current feelings of hopelessness. Pt able to process.?Pt engaged in discussion.?      Session Time: 10:00 am - 11:00 am  Participation Level: Active  Behavioral Response: CasualAlertAnxious and Depressed  Type of Therapy: Group Therapy  Treatment Goals addressed: Coping  Progress Towards Goals: Initial  Interventions: CBT, DBT, Solution Focused, Strength-based, Supportive, and Reframing  Therapist Response: Clinician led processing group for pt's current struggles. Group members shared stressors and provided support and feedback. Clinician brought in topics of self-care to inform discussion.  Summary: Pt able to process and provide support to group.     Session Time: 11:00 am - 12:00  pm  Participation Level: Active  Behavioral Response: CasualAlertAnxious and Depressed  Type of Therapy: Group Therapy  Treatment Goals addressed: Coping  Progress Towards Goals: Initial  Interventions: CBT, DBT, Solution Focused, Strength-based, Supportive, and Reframing  Therapist Response: Group viewed Ted Talk entitled How To Not Take Things Personally presented by Jacqueline Griffith. Cln utilized CBT principles to inform discussion while pts were encouraged to share their response to the video. Cln asked each patient to share a time in which they took things personally and reframed the situation based on what was shared in the [place] Talk.  Summary: Pt engaged in discussion. Pt shares the expression, People are not against you. They are for themselves. Pt demonstrates good insight into the subject matter.   Session Time: 12:00 pm - 12:30 pm  Participation Level: Active  Behavioral Response: CasualAlertAnxious and Depressed  Type of Therapy: Group Therapy  Treatment Goals addressed: Coping  Progress Towards Goals: Initial  Interventions: Psychologist, Occupational, Supportive  Therapist Response: Reflection Group: Patients encouraged to practice skills and interpersonal techniques or work on mindfulness and relaxation techniques. The importance of self-care and making skills part of a routine to increase usage were stressed.  Summary: Patient engaged and participated appropriately.   Session Time: 12:30 pm - 1:30 pm  Participation Level: Active  Behavioral Response: CasualAlertAnxious and Depressed  Type of Therapy: Group Therapy  Treatment Goals addressed: Coping  Progress Towards Goals: Initial  Interventions: CBT, DBT, Solution Focused, Strength-based, Supportive, and Reframing  Therapist Response: Group was led by occupational therapist, Jacqueline Griffith.   Summary: Pt engaged and participated in discussion.   Session Time: 1:30 pm - 2:00 pm  Participation  Level: Active  Behavioral Response: CasualAlertAnxious and Depressed  Type of Therapy: Group Therapy  Treatment Goals addressed:  Coping  Progress Towards Goals: Initial  Interventions: CBT, DBT, Solution Focused, Strength-based, Supportive, and Reframing  Therapist Response: 1:30 pm - 1:50 pm:  Clinician led group on finding joy and asked each pt to identify three things they enjoy. Clinician also requested pts schedule a time this week to engage in something which brings them joy. 1:50 - 2:00 pm: Clinician led check-out. Clinician assessed for immediate needs, medication compliance and efficacy, and safety concerns?  Summary: 1:30 pm - 1:50 pm: Pt participated in discussion and states she Jacqueline go on a nature walk this week. 1:50 - 2:00 pm: At check-out, patient contracts for safety.?Patient demonstrates progress as evidenced by her engagement and by being receptive to treatment. Patient denies SI/HI/self-harm thoughts at the end of group and agrees to seek help should those thoughts/feelings occur.?    Suicidal/Homicidal: Nowithout intent/plan  Plan: ?Pt Jacqueline continue in PHP and medication management while continuing to work on decreasing depression symptoms,?SI, and anxiety symptoms,?and increasing the ability to self manage symptoms.      Collaboration of Care: Medication Management AEB Jacqueline Kerns, NP  Patient/Guardian was advised Release of Information must be obtained prior to any record release in order to collaborate their care with an outside provider. Patient/Guardian was advised if they have not already done so to contact the registration department to sign all necessary forms in order for us  to release information regarding their care.   Consent: Patient/Guardian gives verbal consent for treatment and assignment of benefits for services provided during this visit. Patient/Guardian expressed understanding and agreed to proceed.   Diagnosis: Major depressive disorder,  recurrent, severe without psychotic features (HCC) [F33.2]    1. Major depressive disorder, recurrent, severe without psychotic features [facility])       Jacqueline LILLETTE Pollack, LCSW 07/25/2024  "

## 2024-07-26 ENCOUNTER — Ambulatory Visit (HOSPITAL_COMMUNITY)

## 2024-07-27 ENCOUNTER — Ambulatory Visit (HOSPITAL_COMMUNITY)

## 2024-07-27 ENCOUNTER — Telehealth (HOSPITAL_COMMUNITY): Payer: Self-pay | Admitting: Licensed Clinical Social Worker

## 2024-07-27 NOTE — Progress Notes (Addendum)
 "  Psychiatric Initial Adult Assessment   Patient Identification: Jacqueline Griffith MRN:  [id number] Date of Evaluation:  07/27/2024 Referral Source: [facility], [place]  Chief Complaint: worsening depression  Visit Diagnosis:    ICD-10-CM   1. Major depressive disorder, recurrent, severe without psychotic features (HCC)  F33.2       History of Present Illness:  Jacqueline Griffith 34 year old female who presents after attending residential treatment facility [facility] in [place] .  Patient was seen and evaluted face to face by this provider. Jacqueline Griffith reported experiencing intense spiraling.   She reports a current diagnosis related to major depressive disorder, borderline personality disorder and posttraumatic stress disorder.  Reports experiencing suicidal ideations denying plan or intent.  States she is currently followed by a Rupinder for medication management.  Reports she is currently prescribed Caplyta, Auvelity , propranolol and receiving Spravato treatments.  Stated she noticed her mental health started to decline after an episode between she and her significant others ex-wife.  Reports she has been together with her boyfriend for the past 4 years.  States she was currently residing with her partner and their 2 children.  States they had custody 1 week on 2 weeks off stated her partner's spouse got upset and advised her that she was too involved in their children's relationship.  She reports having a difficult time processing her feelings related to her role and their children's lives.  She reports previous inpatient admissions.  Reports previous suicide attempts Does admit to daily marijuana use however, states since she was recently discharged from residential treatment she has not utilize marijuana or alcohol.  Reports she is currently residing at home.  States her parents are supportive.  States she has been taking and tolerating medications well.   Patient to start partial outpatient programming on 07/25/2024  Per initial admission assessment note:Jacqueline Griffith reports feeling extremely anxious today and acknowledges passive suicidal ideation without a plan or prior suicide attempts. She states she is currently taking medications to help manage her ongoing suicidal thoughts; however, her emotions remain intense. She denies having an active plan to end her life but endorses intrusive and intense suicidal thoughts.   During evaluation Jacqueline Griffith is sitting she is alert/oriented x 4; calm/cooperative; and mood congruent with affect.  Patient is speaking in a clear tone at moderate volume, and normal pace; with good eye contact.Her thought process is coherent and relevant; There is no indication tha she is currently responding to internal/external stimuli or experiencing delusional thought content.  Patient denies suicidal/self-harm/homicidal ideation, psychosis, and paranoia.  Patient has remained calm throughout assessment and has answered questions appropriately.    Associated Signs/Symptoms: Depression Symptoms:  depressed mood, feelings of worthlessness/guilt, difficulty concentrating, anxiety, (Hypo) Manic Symptoms:  Distractibility, Anxiety Symptoms:  Excessive Worry, Psychotic Symptoms:  Hallucinations: None PTSD Symptoms: Reports diagnosis of PTSD in 2020 due to treatment about ex-boyfriend   Past Psychiatric History: Major Depressive Disorder, Anxiety Disorder, Borderline Personality Disorder (BPD), ADHD, and PTSD   Previous Psychotropic Medications: Yes   Substance Abuse History in the last 12 months:  Yes.    Consequences of Substance Abuse: NA  Past Medical History:  Past Medical History:  Diagnosis Date   Acne    hx accutane use    ADHD (attention deficit hyperactivity disorder)    Depression    Frostbite    toes   H/O: substance abuse (HCC)    Thyromegaly    No past surgical history on file.  Family  Psychiatric History:   Family History: No family history on file.  Social History:   Social History   Socioeconomic History   Marital status: Single    Spouse name: Not on file   Number of children: Not on file   Years of education: Not on file   Highest education level: Not on file  Occupational History   Not on file  Tobacco Use   Smoking status: Former    Current packs/day: 0.00    Types: Cigarettes    Quit date: 06/13/2009    Years since quitting: 15.1   Smokeless tobacco: Not on file  Substance and Sexual Activity   Alcohol use: Yes    Comment: Occ.   Drug use: Yes    Types: Amphetamines, Marijuana   Sexual activity: Yes    Birth control/protection: None  Other Topics Concern   Not on file  Social History Narrative   Daily Caffeine   [employer] student   Quit tobacco 12 /10   Lives near campus               Social Drivers of Health   Tobacco Use: Medium Risk (05/05/2024)   Patient History    Smoking Tobacco Use: Former    Smokeless Tobacco Use: Unknown    Passive Exposure: Not on Actuary Strain: Not on file  Food Insecurity: No Food Insecurity (05/05/2024)   Epic    Worried About Programme Researcher, Broadcasting/film/video in the Last Year: Never true    Ran Out of Food in the Last Year: Never true  Transportation Needs: No Transportation Needs (05/05/2024)   Epic    Lack of Transportation (Medical): No    Lack of Transportation (Non-Medical): No  Physical Activity: Not on file  Stress: Not on file  Social Connections: Moderately Isolated (05/05/2024)   Social Connection and Isolation Panel    Frequency of Communication with Friends and Family: Three times a week    Frequency of Social Gatherings with Friends and Family: Twice a week    Attends Religious Services: Never    Database Administrator or Organizations: No    Attends Banker Meetings: Never    Marital Status: Living with partner  Depression (PHQ2-9): High Risk (07/25/2024)   Depression  (PHQ2-9)    PHQ-2 Score: 19  Alcohol Screen: Medium Risk (05/04/2024)   Alcohol Screen    Last Alcohol Screening Score (AUDIT): 14  Housing: Low Risk (05/05/2024)   Epic    Unable to Pay for Housing in the Last Year: No    Number of Times Moved in the Last Year: 0    Homeless in the Last Year: No  Utilities: Not At Risk (05/05/2024)   Epic    Threatened with loss of utilities: No  Health Literacy: Not on file    Additional Social History:   Allergies:  Allergies[1]  Metabolic Disorder Labs: Lab Results  Component Value Date   HGBA1C 4.5 (L) 05/04/2024   MPG 82.45 05/04/2024   No results found for: PROLACTIN Lab Results  Component Value Date   CHOL 145 05/08/2024   TRIG 84 05/08/2024   HDL 37 (L) 05/08/2024   CHOLHDL 3.9 05/08/2024   VLDL 17 05/08/2024   LDLCALC 91 05/08/2024   LDLCALC 67 10/12/2012   Lab Results  Component Value Date   TSH 0.643 05/04/2024    Therapeutic Level Labs: No results found for: LITHIUM No results found for: CBMZ No results found  for: VALPROATE  Current Medications: Current Outpatient Medications  Medication Sig Dispense Refill   AUVELITY  45-105 MG TBCR Take 1 tablet by mouth in the morning and at bedtime.     dexmethylphenidate  (FOCALIN  XR) 20 MG 24 hr capsule Take 20 mg by mouth daily.     diphenhydrAMINE  HCl, Sleep, (ZZZQUIL PO) Take 1 tablet by mouth at bedtime as needed (Sleep).     Esketamine HCl, 56 MG Dose, (SPRAVATO, 56 MG DOSE,) 28 MG/DEVICE SOPK Place 56 mg into the nose 2 (two) times a week.     hydrOXYzine  (ATARAX ) 25 MG tablet Take 25 mg by mouth 3 (three) times daily as needed for anxiety.     Multiple Vitamin (MULTIVITAMIN PO) Take 1 Dose by mouth daily. Take 1 gummy every day.     propranolol (INDERAL) 20 MG tablet Take 20 mg by mouth 3 (three) times daily.     No current facility-administered medications for this visit.    Musculoskeletal: Strength & Muscle Tone: within normal limits Gait & Station:  normal Patient leans: N/A  Psychiatric Specialty Exam: Review of Systems  There were no vitals taken for this visit.There is no height or weight on file to calculate BMI.  General Appearance: Casual  Eye Contact:  Good  Speech:  Clear and Coherent  Volume:  Normal  Mood:  Anxious and Dysphoric  Affect:  Congruent  Thought Process:  Coherent  Orientation:  Full (Time, Place, and Person)  Thought Content:  Logical  Suicidal Thoughts:  No  Homicidal Thoughts:  No  Memory:  Negative NA  Judgement:  Good  Insight:  Good  Psychomotor Activity:  Normal  Concentration:  Concentration: Good  Recall:  Good  Fund of Knowledge:Good  Language: Good  Akathisia:  No  Handed:  Right  AIMS (if indicated):  not done  Assets:  Communication Skills Desire for Improvement  ADL's:  Intact  Cognition: WNL  Sleep:  Good   Screenings: AUDIT    Flowsheet Row Admission (Discharged) from [facility]/04/20[facility] in [facility] INPATIENT BEHAVIORAL MEDICINE  Alcohol Use Disorder Identification Test Final Score (AUDIT) 14   GAD-7    Flowsheet Row Counselor from 07/25/2024 in [facility]  Total GAD-7 Score 15   PHQ2-9    Flowsheet Row Counselor from 07/25/2024 in [facility] Counselor from 07/18/2024 in [facility]  PHQ-2 Total Score 6 5  PHQ-9 Total Score 19 19   Flowsheet Row Counselor from 07/18/2024 in [facility] Most recent reading at 07/18/2024  1:00 PM Admission (Discharged) from 05/04/2024 in [facility] INPATIENT BEHAVIORAL MEDICINE Most recent reading at 05/05/2024  1:33 AM ED from 05/04/2024 in [facility] Most recent reading at 05/04/2024  4:00 PM  C-SSRS RISK CATEGORY Error: Q3, 4, or 5 should not be populated when Q2 is No Low Risk Low Risk    Assessment and Plan: Chanti Doubleday enrolled in Partial Hospitalization Program, patient's current medications are to be  continued.  a comprehensive treatment plan will be developed and side effects of medications have been reviewed with patient  Treatment options and alternatives reviewed with patient and patient understands the above plan. Treatment plan was reviewed and agreed upon by NP T.Ezzard and patient Sabria Milius need for group services.  Collaboration of Care: Medication Management AEB continue as directed  Patient/Guardian was advised Release of Information must be obtained prior to any record release in order to collaborate their care  with an outside provider. Patient/Guardian was advised if they have not already done so to contact the registration department to sign all necessary forms in order for us  to release information regarding their care.   Consent: Patient/Guardian gives verbal consent for treatment and assignment of benefits for services provided during this visit. Patient/Guardian expressed understanding and agreed to proceed.   Staci LOISE Kerns, NP 1/14/202612:51 PM     [1] No Known Allergies  "

## 2024-07-27 NOTE — Telephone Encounter (Signed)
 Cln called pt's emergency contact. Call went to vm and cln left vm for callback. Pt's father immediately called back. Cln informed him that she was expected for an appt and we are not able to reach her. Pt's dad reports he talked to her last night and she was in a good mood. He states he will call her and have her call us  or he will call back to confirm safety.

## 2024-07-28 ENCOUNTER — Ambulatory Visit (HOSPITAL_COMMUNITY)

## 2024-07-28 ENCOUNTER — Encounter (HOSPITAL_COMMUNITY): Payer: Self-pay

## 2024-07-28 ENCOUNTER — Ambulatory Visit (INDEPENDENT_AMBULATORY_CARE_PROVIDER_SITE_OTHER): Admitting: Licensed Clinical Social Worker

## 2024-07-28 DIAGNOSIS — R4589 Other symptoms and signs involving emotional state: Secondary | ICD-10-CM

## 2024-07-28 DIAGNOSIS — F411 Generalized anxiety disorder: Secondary | ICD-10-CM

## 2024-07-28 DIAGNOSIS — F603 Borderline personality disorder: Secondary | ICD-10-CM

## 2024-07-28 DIAGNOSIS — F332 Major depressive disorder, recurrent severe without psychotic features: Secondary | ICD-10-CM

## 2024-07-28 NOTE — Progress Notes (Signed)
 Spoke with patient in person for PHP. States that she was in a residential program in [place] after a stay in [facility]. She was then referred to [facility] for depression/anxiety. She felt hopeless and depressed. Has done Ketamine injections in the past. Main stressors are finding a job and her relationship. Has been with her boyfriend for 4 years but has known him since she was 55. He has 2 kids that she adores. Gets along well with the mother of the children until recently things have been strained and she is unsure why. Her boyfriend wanted to take a step back in the relationship and not live together. She is trying to process the new way of living and not seeing the kids as much. On scale 1-10 as 10 being worst she rates depression at 2 and anxiety at 3. Denies SI/HI or AVH. PHQ9=11. No side effects from medications. Did say she has trouble with certain noise and was hoping to get a sound machine that she can use in group. She thinks it will help her to focus on one sound instead of chip bags opening or other bothersome noise. Message sent to therapist to discuss options with patient. No other issues or complaints.

## 2024-07-29 ENCOUNTER — Ambulatory Visit (INDEPENDENT_AMBULATORY_CARE_PROVIDER_SITE_OTHER): Admitting: Licensed Clinical Social Worker

## 2024-07-29 ENCOUNTER — Ambulatory Visit (HOSPITAL_COMMUNITY)

## 2024-07-29 DIAGNOSIS — F603 Borderline personality disorder: Secondary | ICD-10-CM

## 2024-07-29 DIAGNOSIS — F332 Major depressive disorder, recurrent severe without psychotic features: Secondary | ICD-10-CM | POA: Diagnosis not present

## 2024-07-29 DIAGNOSIS — R4589 Other symptoms and signs involving emotional state: Secondary | ICD-10-CM

## 2024-07-29 DIAGNOSIS — F411 Generalized anxiety disorder: Secondary | ICD-10-CM

## 2024-08-01 ENCOUNTER — Ambulatory Visit (HOSPITAL_COMMUNITY)

## 2024-08-01 ENCOUNTER — Ambulatory Visit (INDEPENDENT_AMBULATORY_CARE_PROVIDER_SITE_OTHER): Admitting: Licensed Clinical Social Worker

## 2024-08-01 DIAGNOSIS — F332 Major depressive disorder, recurrent severe without psychotic features: Secondary | ICD-10-CM

## 2024-08-01 DIAGNOSIS — R4589 Other symptoms and signs involving emotional state: Secondary | ICD-10-CM

## 2024-08-01 NOTE — Psych (Signed)
 " [facility] PHP THERAPIST PROGRESS NOTE  Jacqueline Griffith [id number]   Session Time: 9:00 am - 10:00 am  Participation Level: Active  Behavioral Response: CasualAlertAnxious and Depressed  Type of Therapy: Group Therapy  Treatment Goals addressed: Coping  Progress Towards Goals: Progressing  Interventions: CBT, DBT, Solution Focused, Strength-based, Supportive, and Reframing  Therapist Response: Clinician led check-in regarding current stressors and situation, and review of patient completed daily inventory. Clinician utilized active listening and empathetic response and validated patient emotions. Clinician facilitated processing group on pertinent issues.?   Summary: Patient arrived within time allowed. Patient rates their depression at a 2 and anxiety at a 2 on a scale of 1-10 with 10 being best. Jacqueline Griffith reports she was in a bad place on Friday and reached out to a friend to help her cope. She states she spent 1.5 hours on the phone with them, which was a lot for her. She reports feeling frustrated with herself for overeating and not cleaning as she planned. When asked about sleep and appetite, Jacqueline Griffith reports they slept 9.5 hours last night and ate 2 meals yesterday. Jacqueline Griffith denied experiencing SI/SH thoughts and feelings of hopelessness since last session. Jacqueline Griffith able to process.?Jacqueline Griffith engaged in discussion.?      Session Time: 10:00 am - 11:00 am  Participation Level: Active  Behavioral Response: CasualAlertAnxious and Depressed  Type of Therapy: Group Therapy  Treatment Goals addressed: Coping  Progress Towards Goals: Progressing  Interventions: CBT, DBT, Solution Focused, Strength-based, Supportive, and Reframing  Therapist Response: Clinician led processing group for Jacqueline Griffith's current struggles. Group members shared stressors and provided support and feedback. Clinician brought in topics of SMART goals to inform discussion.  Summary: Jacqueline Griffith able to process and provide support to group.      Session Time: 11:00 am - 12:00 pm  Participation Level: Active  Behavioral Response: CasualAlertAnxious and Depressed  Type of Therapy: Group Therapy  Treatment Goals addressed: Coping  Progress Towards Goals: Progressing  Interventions: CBT, DBT, Solution Focused, Strength-based, Supportive, and Reframing  Therapist Response: Clinician led group on value exploration utilizing Values Discussion Cards handout. Clinician asked Jacqueline Griffith a series of questions designed to help patients identify their values and how those values impact their daily lives. Clinician processed answers with patients and utilized ACT principles to inform discussion.   Summary: Jacqueline Griffith engaged in discussion. Jacqueline Griffith demonstrated good insight and was receptive to feedback.   Session Time: 12:00 pm - 12:30 pm  Participation Level: Active  Behavioral Response: CasualAlertAnxious and Depressed  Type of Therapy: Group Therapy  Treatment Goals addressed: Coping  Progress Towards Goals: Progressing  Interventions: Psychologist, Occupational, Supportive  Therapist Response: Reflection Group: Patients encouraged to practice skills and interpersonal techniques or work on mindfulness and relaxation techniques. The importance of self-care and making skills part of a routine to increase usage were stressed.  Summary: Patient engaged and participated appropriately.   Session Time: 12:30 pm - 1:30 pm  Participation Level: Active  Behavioral Response: CasualAlertAnxious and Depressed  Type of Therapy: Group Therapy  Treatment Goals addressed: Coping  Progress Towards Goals: Progressing  Interventions: CBT, DBT, Solution Focused, Strength-based, Supportive, and Reframing  Therapist Response: Group was led by occupational therapist, Edward Hollan.   Summary: Jacqueline Griffith engaged and participated in discussion.   Session Time: 1:30 pm - 1:45 pm  Participation Level: Active  Behavioral Response: CasualAlertAnxious and  Depressed  Type of Therapy: Group Therapy  Treatment Goals addressed: Coping  Progress Towards Goals: Progressing  Interventions: CBT, DBT,  Solution Focused, Strength-based, Supportive, and Reframing  Therapist Response: 1:30 pm - 1:45 pm: Clinician led check-out. Clinician assessed for immediate needs, medication compliance and efficacy, and safety concerns?  Summary: 1:30 pm - 1:45 pm: At check-out, patient contracts for safety.?Patient demonstrates progress as evidenced by her continued engagement and by being receptive to treatment. Patient denies SI/HI/self-harm thoughts at the end of group and agrees to seek help should those thoughts/feelings occur.?    Suicidal/Homicidal: Nowithout intent/plan  Plan: ?Jacqueline Griffith will continue in PHP and medication management while continuing to work on decreasing depression symptoms,?SI, and anxiety symptoms,?and increasing the ability to self manage symptoms.     Collaboration of Care: Medication Management AEB Staci Kerns, NP  Patient/Guardian was advised Release of Information must be obtained prior to any record release in order to collaborate their care with an outside provider. Patient/Guardian was advised if they have not already done so to contact the registration department to sign all necessary forms in order for us  to release information regarding their care.   Consent: Patient/Guardian gives verbal consent for treatment and assignment of benefits for services provided during this visit. Patient/Guardian expressed understanding and agreed to proceed.   Diagnosis: Major depressive disorder, recurrent, severe without psychotic features (HCC) [F33.2]    1. Major depressive disorder, recurrent, severe without psychotic features [facility])       Will LILLETTE Pollack, LCSW 08/01/2024  "

## 2024-08-02 ENCOUNTER — Ambulatory Visit (HOSPITAL_COMMUNITY)

## 2024-08-02 NOTE — Progress Notes (Signed)
 [facility] MD/PA/NP OP Progress Note  08/02/2024 12:04 PM Jacqueline Griffith  MRN:  [id number]  Chief Complaint: Weekly progress assessment while attending partial hospitalization outpatient programming.(PHP)  Jacqueline Griffith 34 year old seen and evaluated face-to-face for weekly assessment.  She is denying suicidal or homicidal ideations.  Denies auditory visual hallucinations.  Rating her depression 2 out of 10 with 10 being the worst.  Reports overall her mood is stabilized and she has been attending group setting.  States she has set new years goals and is having  compulsions to do the wrong thing.  Reported  I just want to stop at [facility] and get some fries and a burger.  She reports she is attempting to stay motivated related to physical health.  States her parents have been coming by the visit to keep her occupied.  Reports she recently requested medication refill for Caplyta.  Stated her psychiatrist will provide medication samples until her insurance covers her medications.  No additional concerns noted.  Patient to continue PHP.  Reports good appetite.  States she is resting well throughout the night.  Support encouragement reassurance was provided  HPI:  Visit Diagnosis:    ICD-10-CM   1. Major depressive disorder, recurrent, severe without psychotic features (HCC)  F33.2       Past Psychiatric History: History related to major depressive disorder, borderline personality, attention deficit disorder and generalized anxiety disorder.  Previous inpatient admissions recently completed residential treatment program  Past Medical History:  Past Medical History:  Diagnosis Date   Acne    hx accutane use    ADHD (attention deficit hyperactivity disorder)    Depression    Frostbite    toes   H/O: substance abuse (HCC)    History of polycystic ovarian syndrome    Thyromegaly     Past Surgical History:  Procedure Laterality Date   HIP SURGERY Right 2019    Family Psychiatric History:    Family History:  Family History  Problem Relation Age of Onset   ADD / ADHD Mother    Anxiety disorder Mother    Depression Mother    Depression Father    Anxiety disorder Father    Alcohol abuse Father     Social History:  Social History   Socioeconomic History   Marital status: Single    Spouse name: Not on file   Number of children: 0   Years of education: Not on file   Highest education level: Some college, no degree  Occupational History   Not on file  Tobacco Use   Smoking status: Former    Current packs/day: 0.00    Types: Cigarettes    Quit date: 06/13/2009    Years since quitting: 15.1   Smokeless tobacco: Not on file  Vaping Use   Vaping status: Never Used  Substance and Sexual Activity   Alcohol use: Yes    Comment: Occ.   Drug use: Yes    Types: Amphetamines, Marijuana   Sexual activity: Yes    Birth control/protection: None  Other Topics Concern   Not on file  Social History Narrative   Daily Caffeine   [employer] student   Quit tobacco 12 /10   Lives near campus               Social Drivers of Health   Tobacco Use: Medium Risk (07/28/2024)   Patient History    Smoking Tobacco Use: Former    Smokeless Tobacco Use: Unknown    Passive Exposure: Not  on file  Financial Resource Strain: Not on file  Food Insecurity: No Food Insecurity (05/05/2024)   Epic    Worried About Programme Researcher, Broadcasting/film/video in the Last Year: Never true    Ran Out of Food in the Last Year: Never true  Transportation Needs: No Transportation Needs (05/05/2024)   Epic    Lack of Transportation (Medical): No    Lack of Transportation (Non-Medical): No  Physical Activity: Not on file  Stress: Not on file  Social Connections: Moderately Isolated (05/05/2024)   Social Connection and Isolation Panel    Frequency of Communication with Friends and Family: Three times a week    Frequency of Social Gatherings with Friends and Family: Twice a week    Attends Religious Services: Never     Database Administrator or Organizations: No    Attends Banker Meetings: Never    Marital Status: Living with partner  Depression (PHQ2-9): High Risk (07/28/2024)   Depression (PHQ2-9)    PHQ-2 Score: 11  Alcohol Screen: Medium Risk (05/04/2024)   Alcohol Screen    Last Alcohol Screening Score (AUDIT): 14  Housing: Low Risk (05/05/2024)   Epic    Unable to Pay for Housing in the Last Year: No    Number of Times Moved in the Last Year: 0    Homeless in the Last Year: No  Utilities: Not At Risk (05/05/2024)   Epic    Threatened with loss of utilities: No  Health Literacy: Not on file    Allergies: Allergies[1]  Metabolic Disorder Labs: Lab Results  Component Value Date   HGBA1C 4.5 (L) 05/04/2024   MPG 82.45 05/04/2024   No results found for: PROLACTIN Lab Results  Component Value Date   CHOL 145 05/08/2024   TRIG 84 05/08/2024   HDL 37 (L) 05/08/2024   CHOLHDL 3.9 05/08/2024   VLDL 17 05/08/2024   LDLCALC 91 05/08/2024   LDLCALC 67 10/12/2012   Lab Results  Component Value Date   TSH 0.643 05/04/2024   TSH 0.44 10/22/2012    Therapeutic Level Labs: No results found for: LITHIUM No results found for: VALPROATE No results found for: CBMZ  Current Medications: Current Outpatient Medications  Medication Sig Dispense Refill   AUVELITY  45-105 MG TBCR Take 1 tablet by mouth in the morning and at bedtime.     dexmethylphenidate  (FOCALIN  XR) 20 MG 24 hr capsule Take 20 mg by mouth daily.     diphenhydrAMINE  HCl, Sleep, (ZZZQUIL PO) Take 1 tablet by mouth at bedtime as needed (Sleep). (Patient not taking: Reported on 07/28/2024)     Esketamine HCl, 56 MG Dose, (SPRAVATO, 56 MG DOSE,) 28 MG/DEVICE SOPK Place 56 mg into the nose 2 (two) times a week.     hydrOXYzine  (ATARAX ) 25 MG tablet Take 25 mg by mouth 3 (three) times daily as needed for anxiety. (Patient not taking: Reported on 07/28/2024)     hydrOXYzine  (ATARAX ) 50 MG tablet Take 50 mg by mouth 2  (two) times daily.     lumateperone tosylate (CAPLYTA) 10.5 MG capsule Take 10.5 mg by mouth daily.     Multiple Vitamin (MULTIVITAMIN PO) Take 1 Dose by mouth daily. Take 1 gummy every day. (Patient not taking: Reported on 07/28/2024)     propranolol (INDERAL) 20 MG tablet Take 20 mg by mouth 3 (three) times daily. Taking 10mg  daily per patient     traZODone  (DESYREL ) 50 MG tablet Take 50 mg by mouth at bedtime.  No current facility-administered medications for this visit.     Musculoskeletal: Strength & Muscle Tone: within normal limits Gait & Station: normal Patient leans: N/A  Psychiatric Specialty Exam: Review of Systems  There were no vitals taken for this visit.There is no height or weight on file to calculate BMI.  General Appearance: Casual  Eye Contact:  Good  Speech:  Clear and Coherent  Volume:  Normal  Mood:  Anxious and Depressed  Affect:  Congruent  Thought Process:  Coherent  Orientation:  Full (Time, Place, and Person)  Thought Content: Logical   Suicidal Thoughts:  No  Homicidal Thoughts:  No  Memory:  Immediate;   Good  Judgement:  Good  Insight:  Good  Psychomotor Activity:  Normal  Concentration:  Concentration: Good  Recall:  Good  Fund of Knowledge: Good  Language: Good  Akathisia:  No  Handed:  Right  AIMS (if indicated): not done  Assets:  Communication Skills Desire for Improvement  ADL's:  Intact  Cognition: WNL  Sleep:  Good   Screenings: AUDIT    Flowsheet Row Admission (Discharged) from 05/04/2024 in [facility] INPATIENT BEHAVIORAL MEDICINE  Alcohol Use Disorder Identification Test Final Score (AUDIT) 14   GAD-7    Flowsheet Row Counselor from 07/25/2024 in [facility]  Total GAD-7 Score 15   PHQ2-9    Flowsheet Row Counselor from 07/28/2024 in [facility] Counselor from 07/25/2024 in [facility] Counselor from 07/18/2024 in [facility]  PHQ-2 Total Score 4 6 5   PHQ-9 Total Score 11 19 19    Flowsheet Row Counselor from 07/18/2024 in [facility] Most recent reading at 07/18/2024  1:00 PM Admission (Discharged) from 05/04/2024 in [facility] INPATIENT BEHAVIORAL MEDICINE Most recent reading at 05/05/2024  1:33 AM ED from 05/04/2024 in [facility] Most recent reading at 05/04/2024  4:00 PM  C-SSRS RISK CATEGORY Error: Q3, 4, or 5 should not be populated when Q2 is No Low Risk Low Risk     Assessment and Plan:  Continue partial hospitalization outpatient programming (PHP) Continue medications as directed  Collaboration of Care: Collaboration of Care: Medication Management AEB Caplyta, Focalin  and trazodone   Patient/Guardian was advised Release of Information must be obtained prior to any record release in order to collaborate their care with an outside provider. Patient/Guardian was advised if they have not already done so to contact the registration department to sign all necessary forms in order for us  to release information regarding their care.   Consent: Patient/Guardian gives verbal consent for treatment and assignment of benefits for services provided during this visit. Patient/Guardian expressed understanding and agreed to proceed.    Staci LOISE Kerns, NP 08/02/2024, 12:04 PM     [1] No Known Allergies

## 2024-08-03 ENCOUNTER — Ambulatory Visit (INDEPENDENT_AMBULATORY_CARE_PROVIDER_SITE_OTHER): Admitting: Licensed Clinical Social Worker

## 2024-08-03 ENCOUNTER — Encounter (HOSPITAL_COMMUNITY): Payer: Self-pay

## 2024-08-03 ENCOUNTER — Ambulatory Visit (HOSPITAL_COMMUNITY)

## 2024-08-03 DIAGNOSIS — F332 Major depressive disorder, recurrent severe without psychotic features: Secondary | ICD-10-CM

## 2024-08-03 DIAGNOSIS — F411 Generalized anxiety disorder: Secondary | ICD-10-CM

## 2024-08-03 DIAGNOSIS — F603 Borderline personality disorder: Secondary | ICD-10-CM

## 2024-08-04 ENCOUNTER — Ambulatory Visit (INDEPENDENT_AMBULATORY_CARE_PROVIDER_SITE_OTHER): Admitting: Licensed Clinical Social Worker

## 2024-08-04 ENCOUNTER — Ambulatory Visit (HOSPITAL_COMMUNITY)

## 2024-08-04 ENCOUNTER — Encounter (HOSPITAL_COMMUNITY): Payer: Self-pay

## 2024-08-04 DIAGNOSIS — F603 Borderline personality disorder: Secondary | ICD-10-CM

## 2024-08-04 DIAGNOSIS — F332 Major depressive disorder, recurrent severe without psychotic features: Secondary | ICD-10-CM | POA: Diagnosis not present

## 2024-08-04 DIAGNOSIS — F411 Generalized anxiety disorder: Secondary | ICD-10-CM

## 2024-08-04 NOTE — Progress Notes (Signed)
 Spoke with patient in person for PHP. Depressed flat affect, feels more depressed today than last week. She has come to the realization that she is alone and things may not work with her boyfriend. He is telling her that he needs space. They have couples counseling tomorrow but she is not sure what is happening. Boyfriend is saying he needs more time and not wanting to hang out with her. One of his kids has a birthday this weekend and she has not been invited. She is crying while she talks about it. Feels like she is not processing very well. Had trouble getting out of bed today. Started feeling extra bad on Monday with increase in depression. Has no motivation. Has gained 20 lbs on new medication and has trouble even looking at herself. Finds her purpose in her boyfriend and his kids. She has not been able to function very well. Appetite is fluctuating. She has no motivation to exercise. On scale 1-10 as 10 being worst she rates depression at 9 since she was able to get here she did not rate it 10. Anxiety is 4. Denies SI/HI and AVH. Just feels hopeless but not suicidal. She will discuss important topics surrounding her boyfriends kids tomorrow at counseling and is hopeful they can arrange a schedule for her to see them.

## 2024-08-05 ENCOUNTER — Ambulatory Visit (HOSPITAL_COMMUNITY)

## 2024-08-05 ENCOUNTER — Telehealth (HOSPITAL_COMMUNITY): Payer: Self-pay | Admitting: Licensed Clinical Social Worker

## 2024-08-05 NOTE — Telephone Encounter (Signed)
 See call intake

## 2024-08-07 ENCOUNTER — Encounter (HOSPITAL_COMMUNITY): Payer: Self-pay

## 2024-08-07 NOTE — Therapy (Signed)
  [facility] [address] [place], [place], [address] Phone: [phone]   Fax:  [phone]  Occupational Therapy Evaluation  Patient Details  Name: Jacqueline Griffith MRN: [id number] Date of Birth: 25-Sep-1990 No data recorded  Encounter Date: 07/25/2024   OT End of Session - 08/07/24 1505     Visit Number 1    Number of Visits 15    Date for Recertification  08/12/24    OT Start Time 1230    OT Stop Time 1330    OT Time Calculation (min) 60 min    Activity Tolerance Patient tolerated treatment well          Past Medical History:  Diagnosis Date   Acne    hx accutane use    ADHD (attention deficit hyperactivity disorder)    Depression    Frostbite    toes   H/O: substance abuse (HCC)    History of polycystic ovarian syndrome    Thyromegaly     Past Surgical History:  Procedure Laterality Date   HIP SURGERY Right 2019    There were no vitals filed for this visit.   Subjective Assessment - 08/07/24 1503     Subjective  I hope to learn new coping skills while I am here.    Pertinent History MDD, GAD    Currently in Pain? No/denies    Pain Score 0-No pain    Multiple Pain Sites No              OT Assessment  OCAIRS Mental Health Interview Summary of Client Scores:  Facilitates participation in occupation Allows participation in occupation Inhibits participation in occupation Restricts participation in occupation Comments:  Roles   X    Habits   X    Personal Causation   X    Values  X     Interests   X    Skills   X    Short-Term Goals   X    Long-term Goals  X     Interpretation of Past Experiences  X     Physical Environment   X    Social Environment   X    Readiness for Change   X      Need for Occupational Therapy:  4 Shows positive occupational participation, no need for OT.   3 Need for minimal intervention/consultative participation   2 Need for OT intervention indicated to restore/improve  participation   1 Need for extensive OT intervention indicated to improve participation.  Referral for follow up services also recommended.   Assessment:  Patient demonstrates behavior that INHIBIT participation in occupation.  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    Plan:  Patient will participate in skilled occupational therapy sessions individually or in a group setting to improve coping skills, psychosocial skills, and emotional skills required to return to prior level of function. Treatment will be 4-5 times per week for 3 weeks.     Group Session:  O: Patient participated in a structured Monday morning Goal Check-In group focused on reviewing previously identified SMART goals, with emphasis on the relevance and personal meaning of each goal as a driver of motivation and follow-through. The group provided an opportunity for patients to reflect on goal progress, identify barriers  encountered over the weekend, and clarify next steps for the upcoming week through guided discussion and peer sharing.   A: Patient demonstrated active engagement throughout the Goal Check-In group, contributing verbally to discussion, reflecting on personal goal progress, and responding appropriately to prompts. Patient displayed intact attention, goal-directed thought process, and emerging insight into barriers and facilitators impacting goal follow-through. Presentation indicates ability to self-monitor progress and apply group content toward upcoming goal planning                     OT Education - 08/07/24 1504     Education Details OCAIRS / Tx: Monday Goal Check-In    Person(s) Educated Patient    Methods Explanation;Handout     Comprehension Verbalized understanding           OT Short Term Goals - 08/07/24 1507       OT SHORT TERM GOAL #1   Title Client will independently identify and modify three areas of the current routine that contribute to increased stress or dysfunction by the end of therapy.    Time 3    Period Weeks    Status On-going    Target Date 08/12/24      OT SHORT TERM GOAL #2   Title Client will independently identify and list three personal triggers that lead to heightened stress or negative emotional responses by the end of 3 sessions.    Status On-going      OT SHORT TERM GOAL #3   Title Client will demonstrate the use of at least two different relaxation techniques (e.g., deep breathing, progressive muscle relaxation) without prompting when faced with a stressful situation in four out of five trials.    Status On-going                   Plan - 08/07/24 1505     Clinical Impression Statement Pt presents with psychosocial deficits across multiple occupational performance domains that inhibit participation in daily life    OT Occupational Profile and History Problem Focused Assessment - Including review of records relating to presenting problem    Occupational performance deficits (Please refer to evaluation for details): Social Participation;Work;Rest and Sleep;Leisure    Psychosocial Skills Interpersonal Interaction;Habits;Environmental  Adaptations;Coping Strategies;Routines and Behaviors    Rehab Potential Good    Clinical Decision Making Limited treatment options, no task modification necessary    Comorbidities Affecting Occupational Performance: None    Modification or Assistance to Complete Evaluation  No modification of tasks or assist necessary to complete eval    OT Frequency 5x / week    OT Duration Other (comment)   3 weeks   OT Treatment/Interventions Psychosocial skills training;Coping strategies training    Consulted and Agree with Plan of Care Patient           Patient will benefit from skilled therapeutic intervention in order to improve the following deficits and impairments:       Psychosocial Skills: Interpersonal Interaction, Habits, Environmental  Adaptations, Coping Strategies, Routines and Behaviors   Visit Diagnosis: Difficulty coping  Major depressive disorder, recurrent, severe without psychotic features (HCC)  GAD (generalized anxiety disorder)  Borderline personality disorder (HCC)    Problem List Patient Active Problem List   Diagnosis Date Noted   Major depressive disorder, recurrent, severe without psychotic features (HCC) 05/05/2024   Abnormal finding on CT scan 10/12/2012   Stomach problems 10/12/2012   Frequent bowel movements 10/12/2012   Nausea and vomiting in adult  08/26/2011   Sore throat 08/26/2011   ADJUSTMENT DISORDER WITH MIXED FEATURES 12/07/2009   SYNCOPE AND COLLAPSE 09/28/2009   MALAISE AND FATIGUE 08/08/2009   FOOT PAIN, BILATERAL 08/21/2008   THROAT PAIN 05/22/2008   TELOGEN EFFLUVIUM 08/20/2007   Abnormalities of hair 08/20/2007    [place] KANDICE Purpura, OT 08/07/2024, 3:11 PM  D[place] Purpura, OT    [facility] [address] [place], [place], [address] Phone: [phone]   Fax:  [phone]  Name: Aria Jarrard MRN: [id number] Date of Birth: 12-26-1990

## 2024-08-07 NOTE — Therapy (Signed)
 [place] [facility] [address] [place], [place], [address] Phone: [phone]   Fax:  [phone]  Occupational Therapy Treatment  Patient Details  Name: Jacqueline Griffith MRN: [id number] Date of Birth: 1991-07-05 No data recorded  Encounter Date: 07/28/2024   OT End of Session - 08/07/24 1617     Visit Number 2    Number of Visits 15    Date for Recertification  08/12/24    OT Start Time 1230    OT Stop Time 1330    OT Time Calculation (min) 60 min          Past Medical History:  Diagnosis Date   Acne    hx accutane use    ADHD (attention deficit hyperactivity disorder)    Depression    Frostbite    toes   H/O: substance abuse (HCC)    History of polycystic ovarian syndrome    Thyromegaly     Past Surgical History:  Procedure Laterality Date   HIP SURGERY Right 2019    There were no vitals filed for this visit.   Subjective Assessment - 08/07/24 1617     Currently in Pain? No/denies    Pain Score 0-No pain            Group Session:  S: Doing better today  O: Group focused on the R (Relevant) component of SMART goals to improve motivation and follow-through. Session emphasized that relevance is the primary driver of goal engagement, as goals that connect to personal values, roles, or immediate benefits require less external motivation to initiate. Patients were guided to examine how unclear or low-relevance goals often lead to avoidance, procrastination, or low effort, and how clarifying personal relevance can front-load motivation and reduce reliance on willpower.   A: Patient actively participated in group discussion, demonstrated understanding that motivation is often created through relevance rather than waiting to feel motivated, and was able to identify at least one personal factor that increases buy-in toward a goal. Patient showed insight into how reframing goals around personal meaning, immediate payoff, or identity  can improve initiation and consistency.   P: Continue to attend PHP OT group sessions 5x week for 3 weeks to promote daily structure, social engagement, and opportunities to develop and utilize adaptive strategies to maximize functional performance in preparation for safe transition and integration back into school, work, and the community. Plan to address topic of tbd in next OT group session.                    OT Education - 08/07/24 1617     Education Details Understanding the R in SMART goals    Person(s) Educated Patient    Methods Explanation;Handout    Comprehension Verbalized understanding           OT Short Term Goals - 08/07/24 1507       OT SHORT TERM GOAL #1   Title Client will independently identify and modify three areas of the current routine that contribute to increased stress or dysfunction by the end of therapy.    Time 3    Period Weeks    Status On-going    Target Date 08/12/24      OT SHORT TERM GOAL #2   Title Client will independently identify and list three personal triggers that lead to heightened stress or negative emotional responses by the end of 3 sessions.    Status On-going      OT SHORT  TERM GOAL #3   Title Client will demonstrate the use of at least two different relaxation techniques (e.g., deep breathing, progressive muscle relaxation) without prompting when faced with a stressful situation in four out of five trials.    Status On-going                   Plan - 08/07/24 1617     Psychosocial Skills Interpersonal Interaction;Habits;Environmental  Adaptations;Coping Strategies;Routines and Behaviors          Patient will benefit from skilled therapeutic intervention in order to improve the following deficits and impairments:       Psychosocial Skills: Interpersonal Interaction, Habits, Environmental  Adaptations, Coping Strategies, Routines and Behaviors   Visit Diagnosis: Difficulty coping    Problem  List Patient Active Problem List   Diagnosis Date Noted   Major depressive disorder, recurrent, severe without psychotic features (HCC) 05/05/2024   Abnormal finding on CT scan 10/12/2012   Stomach problems 10/12/2012   Frequent bowel movements 10/12/2012   Nausea and vomiting in adult 08/26/2011   Sore throat 08/26/2011   ADJUSTMENT DISORDER WITH MIXED FEATURES 12/07/2009   SYNCOPE AND COLLAPSE 09/28/2009   MALAISE AND FATIGUE 08/08/2009   FOOT PAIN, BILATERAL 08/21/2008   THROAT PAIN 05/22/2008   TELOGEN EFFLUVIUM 08/20/2007   Abnormalities of hair 08/20/2007    [place] KANDICE Purpura, OT 08/07/2024, 4:18 PM  D[place] Purpura, OT   [facility] [address] [place], [place], [address] Phone: [phone]   Fax:  [phone]  Name: Clytee Heinrich MRN: [id number] Date of Birth: 05/07/1991

## 2024-08-08 ENCOUNTER — Ambulatory Visit (HOSPITAL_COMMUNITY)

## 2024-08-08 ENCOUNTER — Ambulatory Visit (HOSPITAL_COMMUNITY): Admitting: Licensed Clinical Social Worker

## 2024-08-08 DIAGNOSIS — F332 Major depressive disorder, recurrent severe without psychotic features: Secondary | ICD-10-CM

## 2024-08-08 DIAGNOSIS — R4589 Other symptoms and signs involving emotional state: Secondary | ICD-10-CM

## 2024-08-08 NOTE — Psych (Signed)
 Virtual Visit via Video Note  I connected with Jacqueline Griffith on 08/08/24 at  9:00 AM EST by a video enabled telemedicine application and verified that I am speaking with the correct person using two identifiers.  Location: Patient: pt's home in [place], [place] Provider: clinical home office in [place], [place]   I discussed the limitations of evaluation and management by telemedicine and the availability of in person appointments. The patient expressed understanding and agreed to proceed.    I discussed the assessment and treatment plan with the patient. The patient was provided an opportunity to ask questions and all were answered. The patient agreed with the plan and demonstrated an understanding of the instructions.   The patient was advised to call back or seek an in-person evaluation if the symptoms worsen or if the condition fails to improve as anticipated.  I provided 240 minutes of non-face-to-face time during this encounter.   Will LILLETTE Pollack, LCSW   [facility] BH PHP THERAPIST PROGRESS NOTE  Jacqueline Griffith [id number]   Session Time: 9:00 am - 10:00 am  Participation Level: Active  Behavioral Response: CasualAlertAnxious and Depressed  Type of Therapy: Group Therapy  Treatment Goals addressed: Coping  Progress Towards Goals: Progressing  Interventions: CBT, DBT, Solution Focused, Strength-based, Supportive, and Reframing  Therapist Response: Clinician led check-in regarding current stressors and situation, and review of patient completed daily inventory. Clinician utilized active listening and empathetic response and validated patient emotions. Clinician facilitated processing group on pertinent issues.?   Summary: Patient arrived within time allowed.?Patient rates their depression at a 5  and anxiety at a 4 on a scale of 1-10 with 10 being best. Pt reports she went to bed feeling achy, as if she is getting sick. She states her weekend was largely uneventful but on  Saturday was able to spend time with her partner and his children. When asked about sleep and appetite, pt reports they slept 4-5 hours last night and ate normal meals yesterday. Pt denied experiencing SI/SH thoughts since last session. Pt able to process.?Pt engaged in discussion.?     Session Time: 10:00 am - 11:00 am  Participation Level: Active  Behavioral Response: CasualAlertAnxious and Depressed  Type of Therapy: Group Therapy  Treatment Goals addressed: Coping  Progress Towards Goals: Progressing  Interventions: CBT, DBT, Solution Focused, Strength-based, Supportive, and Reframing  Therapist Response: Clinician shared a two-part video describing the Wall of Awful, a metaphor to explain why simple tasks feel difficult. It explores the emotional barriers built by past failures and anxieties, hindering progress. Clinician utilized ACT principles to inform discussion.    Summary: Pt engaged in discussion and shares how she is impacted by her own wall of awful, citing feeling overwhelmed by all of the things she knows she needs to do.    Session Time: 11:00 am - 12:00 pm  Participation Level: Active  Behavioral Response: CasualAlertAnxious and Depressed  Type of Therapy: Group Therapy  Treatment Goals addressed: Coping  Progress Towards Goals: Progressing  Interventions: CBT, DBT, Solution Focused, Strength-based, Supportive, and Reframing  Therapist Response: Clinician guided patients through an exercise called perspective-taking in which patients were asked to visualize themselves holding onto something that is currently causing them pain and to think of it as a memory. Patients were invited to share their thoughts and feelings after the exercise was completed. Clinician utilized ACT principles to inform discussion.   Summary: Pt engaged in discussion. Pt demonstrated good insight into the subject matter.    Session Time:  12:00 pm - 1:00 pm  Participation  Level: Active  Behavioral Response: CasualAlertAnxious and Depressed  Type of Therapy: Group Therapy  Treatment Goals addressed: Coping  Progress Towards Goals: Progressing  Interventions: CBT, DBT, Solution Focused, Strength-based, Supportive, and Reframing  Therapist Response: 12:00 pm - 12:50 pm: Group was led by occupational therapist, Dallas Purpura. 12:50 - 1:00 pm: Clinician led check-out. Clinician assessed for immediate needs, medication compliance and efficacy, and safety concerns?  Summary: 12:00 pm - 12:50 pm: Pt engaged and participated in discussion. 12:50 - 1:00 pm: At check-out, patient contracts for safety.?Patient demonstrates progress as evidenced by her continued engagement and by being receptive to treatment. Patient denies SI/HI/self-harm thoughts at the end of group and agrees to seek help should those thoughts/feelings occur.?   Suicidal/Homicidal: Nowithout intent/plan  Plan: ?Pt will continue in PHP and medication management while continuing to work on decreasing depression symptoms,?SI, and anxiety symptoms,?and increasing the ability to self manage symptoms.      Collaboration of Care: Medication Management AEB Staci Kerns, NP  Patient/Guardian was advised Release of Information must be obtained prior to any record release in order to collaborate their care with an outside provider. Patient/Guardian was advised if they have not already done so to contact the registration department to sign all necessary forms in order for us  to release information regarding their care.   Consent: Patient/Guardian gives verbal consent for treatment and assignment of benefits for services provided during this visit. Patient/Guardian expressed understanding and agreed to proceed.   Diagnosis: Major depressive disorder, recurrent, severe without psychotic features (HCC) [F33.2]    1. Major depressive disorder, recurrent, severe without psychotic features [facility])      Will LILLETTE Pollack, LCSW 08/08/2024

## 2024-08-09 ENCOUNTER — Ambulatory Visit (HOSPITAL_COMMUNITY): Admitting: Professional

## 2024-08-09 ENCOUNTER — Ambulatory Visit (HOSPITAL_COMMUNITY)

## 2024-08-09 DIAGNOSIS — F332 Major depressive disorder, recurrent severe without psychotic features: Secondary | ICD-10-CM

## 2024-08-09 DIAGNOSIS — R4589 Other symptoms and signs involving emotional state: Secondary | ICD-10-CM

## 2024-08-09 NOTE — Psych (Signed)
 Virtual Visit via Video Note  I connected with Jacqueline Griffith on 08/09/24 at  9:00 AM EST by a video enabled telemedicine application and verified that I am speaking with the correct person using two identifiers.  Location: Patient:Home Provider: clinical home office   I discussed the limitations of evaluation and management by telemedicine and the availability of in person appointments. The patient expressed understanding and agreed to proceed.    I discussed the assessment and treatment plan with the patient. The patient was provided an opportunity to ask questions and all were answered. The patient agreed with the plan and demonstrated an understanding of the instructions.   The patient was advised to call back or seek an in-person evaluation if the symptoms worsen or if the condition fails to improve as anticipated.  I provided 240 minutes of non-face-to-face time during this encounter.   [name], [facility]   [facility] BH PHP THERAPIST PROGRESS NOTE  Jacqueline Griffith [id number]   Session Time: 9:00 am - 10:00 am  Participation Level: Active  Behavioral Response: CasualAlertAnxious and Depressed  Type of Therapy: Group Therapy  Treatment Goals addressed: Coping  Progress Towards Goals: Progressing  Interventions: CBT, DBT, Solution Focused, Strength-based, Supportive, and Reframing  Therapist Response: Clinician led check-in regarding current stressors and situation, and review of patient completed daily inventory. Clinician utilized active listening and empathetic response and validated patient emotions. Clinician facilitated processing group on pertinent issues.?   Summary: Patient arrived within time allowed.?Patient rates their depression at a 7  and anxiety at a 3 on a scale of 1-10 with 10 being best. When asked about sleep and appetite, pt reports they slept 4-5 hours last night and ate normal meals yesterday. Pt denied experiencing SI/SH thoughts since last  session. Pt reports she continues to feel cold symptoms and is taking cold meds that are helping somewhat. 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 She shares she thinks it can be beneficial for her to have to get out of the house for the dogs sake.  Pt able to process.?Pt engaged in discussion.?     Session Time: 10:00 am - 11:00 am   Participation Level: Active   Behavioral Response: Casual Alert and Anxious/Depressed   Type of Therapy: Group Therapy   Treatment Goals addressed: Coping   Progress Towards Goals: Progressing   Interventions: CBT, DBT, Solution Focused, Strength-based, Supportive, and Reframing   Therapist Response: Clinician led processing group for pt's current struggles. Group members shared stressors and provided support and feedback. Clinician brought in topics of should statements, communication, and vocalizing personal needs/wants.   Summary: Pt able to process and provide support to group. Pt reports she struggles with being passive and apologizing a lot.   Session Time: 11:00 am - 12:00 pm   Participation Level: Active   Behavioral Response: Casual Alert and Anxious/Depressed   Type of Therapy: Group Therapy   Treatment Goals addressed: Coping   Progress Towards Goals: Progressing   Interventions: CBT, DBT, Solution Focused, Strength-based, Supportive, and Reframing   Therapist Response: Group was led by [facility] chaplain, Amy Delores.   Summary: Pt engaged in discussion.        Session Time: 12:00 pm - 12:50 pm   Participation Level: Active   Behavioral Response: Casual Alert and Anxious/Depressed   Type of Therapy: Group Therapy   Treatment Goals addressed: Coping   Progress  Towards Goals: Progressing   Interventions: OT group   Therapist Response: Group was led by occupational therapist, Edward Hollan.    Summary: Pt engaged in discussion.       Session  Time: 12:50 pm - 1:00 pm   Participation Level: Active   Behavioral Response: Casual Alert and Anxious/Depressed   Type of Therapy: Group Therapy   Treatment Goals addressed: Coping   Progress Towards Goals: Progressing   Interventions: CBT, DBT, Solution Focused, Strength-based, Supportive, and Reframing   Therapist Response: Clinician led check-out. Clinician assessed for immediate needs, medication compliance and efficacy, and safety concerns?   Summary: At check-out, patient reports no immediate concerns. Patient demonstrates progress as evidenced by engagement and responsiveness to treatment. Patient denies SI/HI/self-harm thoughts at the end of group.  Suicidal/Homicidal: Nowithout intent/plan  Plan: ?Pt will continue in PHP and medication management while continuing to work on decreasing depression symptoms,?SI, and anxiety symptoms,?and increasing the ability to self manage symptoms.      Collaboration of Care: Medication Management AEB Staci Kerns, NP  Patient/Guardian was advised Release of Information must be obtained prior to any record release in order to collaborate their care with an outside provider. Patient/Guardian was advised if they have not already done so to contact the registration department to sign all necessary forms in order for us  to release information regarding their care.   Consent: Patient/Guardian gives verbal consent for treatment and assignment of benefits for services provided during this visit. Patient/Guardian expressed understanding and agreed to proceed.   Diagnosis: Major depressive disorder, recurrent, severe without psychotic features (HCC) [F33.2]    1. Major depressive disorder, recurrent, severe without psychotic features [facility])      [name], [facility] 08/09/2024

## 2024-08-10 ENCOUNTER — Encounter (HOSPITAL_COMMUNITY): Payer: Self-pay

## 2024-08-10 ENCOUNTER — Ambulatory Visit (HOSPITAL_COMMUNITY)

## 2024-08-10 NOTE — Therapy (Signed)
 [facility] [address] [place], [place], [address] Phone: [phone]   Fax:  [phone]  Occupational Therapy Treatment  Patient Details  Name: Jacqueline Griffith MRN: [id number] Date of Birth: Nov 24, 1990 No data recorded  Encounter Date: 07/29/2024   OT End of Session - 08/10/24 2043     Visit Number 3    Number of Visits 15    Date for Recertification  08/12/24    OT Start Time 1200    OT Stop Time 1250    OT Time Calculation (min) 50 min    Activity Tolerance Patient tolerated treatment well          Past Medical History:  Diagnosis Date   Acne    hx accutane use    ADHD (attention deficit hyperactivity disorder)    Depression    Frostbite    toes   H/O: substance abuse (HCC)    History of polycystic ovarian syndrome    Thyromegaly     Past Surgical History:  Procedure Laterality Date   HIP SURGERY Right 2019    There were no vitals filed for this visit.   Subjective Assessment - 08/10/24 2043     Currently in Pain? No/denies    Pain Score 0-No pain             Group Session:  S: I think I am doing better today. Plans for the weekend so I am excited for that.   O: Group focused on the R (Relevant) component of SMART goals to improve motivation and follow-through. Session emphasized that relevance is the primary driver of goal engagement, as goals that connect to personal values, roles, or immediate benefits require less external motivation to initiate. Patients were guided to examine how unclear or low-relevance goals often lead to avoidance, procrastination, or low effort, and how clarifying personal relevance can front-load motivation and reduce reliance on willpower.   A: Patient actively participated in group discussion, demonstrated understanding that motivation is often created through relevance rather than waiting to feel motivated, and was able to identify at least one personal factor that increases  buy-in toward a goal. Patient showed insight into how reframing goals around personal meaning, immediate payoff, or identity can improve initiation and consistency.   P: Continue to attend PHP OT group sessions 5x week for 3 weeks to promote daily structure, social engagement, and opportunities to develop and utilize adaptive strategies to maximize functional performance in preparation for safe transition and integration back into school, work, and the community. Plan to address topic of tbd in next OT group session.                   OT Education - 08/10/24 2043     Education Details Understanding the R in SMART goals    Person(s) Educated Patient    Methods Explanation;Handout    Comprehension Verbalized understanding           OT Short Term Goals - 08/07/24 1507       OT SHORT TERM GOAL #1   Title Client will independently identify and modify three areas of the current routine that contribute to increased stress or dysfunction by the end of therapy.    Time 3    Period Weeks    Status On-going    Target Date 08/12/24      OT SHORT TERM GOAL #2   Title Client will independently identify and list three personal triggers that lead to  heightened stress or negative emotional responses by the end of 3 sessions.    Status On-going      OT SHORT TERM GOAL #3   Title Client will demonstrate the use of at least two different relaxation techniques (e.g., deep breathing, progressive muscle relaxation) without prompting when faced with a stressful situation in four out of five trials.    Status On-going                   Plan - 08/10/24 2043     Psychosocial Skills Interpersonal Interaction;Habits;Environmental  Adaptations;Coping Strategies;Routines and Behaviors          Patient will benefit from skilled therapeutic intervention in order to improve the following deficits and impairments:       Psychosocial Skills: Interpersonal Interaction, Habits,  Environmental  Adaptations, Coping Strategies, Routines and Behaviors   Visit Diagnosis: Difficulty coping    Problem List Patient Active Problem List   Diagnosis Date Noted   Major depressive disorder, recurrent, severe without psychotic features (HCC) 05/05/2024   Abnormal finding on CT scan 10/12/2012   Stomach problems 10/12/2012   Frequent bowel movements 10/12/2012   Nausea and vomiting in adult 08/26/2011   Sore throat 08/26/2011   ADJUSTMENT DISORDER WITH MIXED FEATURES 12/07/2009   SYNCOPE AND COLLAPSE 09/28/2009   MALAISE AND FATIGUE 08/08/2009   FOOT PAIN, BILATERAL 08/21/2008   THROAT PAIN 05/22/2008   TELOGEN EFFLUVIUM 08/20/2007   Abnormalities of hair 08/20/2007    Dallas KANDICE Purpura, OT 08/10/2024, 8:44 PM  Dallas Purpura, OT   [facility] [address] [place], [place], [address] Phone: [phone]   Fax:  [phone]  Name: Talonda Artist MRN: [id number] Date of Birth: Nov 04, 1990

## 2024-08-10 NOTE — Therapy (Signed)
 [facility] [address] [place], [place], [address] Phone: [phone]   Fax:  [phone]  Occupational Therapy Treatment  Patient Details  Name: Jacqueline Griffith MRN: [id number] Date of Birth: 08-04-1990 No data recorded  Encounter Date: 08/01/2024   OT End of Session - 08/10/24 2103     Visit Number 4    Number of Visits 15    Date for Recertification  08/12/24    OT Start Time 1230    OT Stop Time 1330    OT Time Calculation (min) 60 min          Past Medical History:  Diagnosis Date   Acne    hx accutane use    ADHD (attention deficit hyperactivity disorder)    Depression    Frostbite    toes   H/O: substance abuse (HCC)    History of polycystic ovarian syndrome    Thyromegaly     Past Surgical History:  Procedure Laterality Date   HIP SURGERY Right 2019    There were no vitals filed for this visit.   Subjective Assessment - 08/10/24 2102     Currently in Pain? No/denies    Pain Score 0-No pain            Group Session:  S: Doing well today.   O: Patient participated in a structured Monday morning Goal Check-In group focused on reviewing previously identified SMART goals, with emphasis on the relevance and personal meaning of each goal as a driver of motivation and follow-through. The group provided an opportunity for patients to reflect on goal progress, identify barriers encountered over the weekend, and clarify next steps for the upcoming week through guided discussion and peer sharing.   A: Patient demonstrated active engagement throughout the Goal Check-In group, contributing verbally to discussion, reflecting on personal goal progress, and responding appropriately to prompts. Patient displayed intact attention, goal-directed thought process, and emerging insight into barriers and facilitators impacting goal follow-through. Presentation indicates ability to self-monitor progress and apply group content toward  upcoming goal planning.   P: Continue to attend PHP OT group sessions 5x week for 3 weeks to promote daily structure, social engagement, and opportunities to develop and utilize adaptive strategies to maximize functional performance in preparation for safe transition and integration back into school, work, and the community. Plan to address topic of tbd in next OT group session.                    OT Education - 08/10/24 2102     Education Details Monday Goal Check In    Person(s) Educated Patient    Methods Explanation;Handout    Comprehension Verbalized understanding           OT Short Term Goals - 08/07/24 1507       OT SHORT TERM GOAL #1   Title Client will independently identify and modify three areas of the current routine that contribute to increased stress or dysfunction by the end of therapy.    Time 3    Period Weeks    Status On-going    Target Date 08/12/24      OT SHORT TERM GOAL #2   Title Client will independently identify and list three personal triggers that lead to heightened stress or negative emotional responses by the end of 3 sessions.    Status On-going      OT SHORT TERM GOAL #3   Title Client will demonstrate the  use of at least two different relaxation techniques (e.g., deep breathing, progressive muscle relaxation) without prompting when faced with a stressful situation in four out of five trials.    Status On-going                   Plan - 08/10/24 2103     Psychosocial Skills Interpersonal Interaction;Habits;Environmental  Adaptations;Coping Strategies;Routines and Behaviors          Patient will benefit from skilled therapeutic intervention in order to improve the following deficits and impairments:       Psychosocial Skills: Interpersonal Interaction, Habits, Environmental  Adaptations, Coping Strategies, Routines and Behaviors   Visit Diagnosis: Difficulty coping    Problem List Patient Active Problem  List   Diagnosis Date Noted   Major depressive disorder, recurrent, severe without psychotic features (HCC) 05/05/2024   Abnormal finding on CT scan 10/12/2012   Stomach problems 10/12/2012   Frequent bowel movements 10/12/2012   Nausea and vomiting in adult 08/26/2011   Sore throat 08/26/2011   ADJUSTMENT DISORDER WITH MIXED FEATURES 12/07/2009   SYNCOPE AND COLLAPSE 09/28/2009   MALAISE AND FATIGUE 08/08/2009   FOOT PAIN, BILATERAL 08/21/2008   THROAT PAIN 05/22/2008   TELOGEN EFFLUVIUM 08/20/2007   Abnormalities of hair 08/20/2007    Dallas KANDICE Purpura, OT 08/10/2024, 9:03 PM  Dallas Purpura, OT   [facility] [address] [place], [place], [address] Phone: [phone]   Fax:  [phone]  Name: Jacqueline Griffith MRN: [id number] Date of Birth: Jan 11, 1991

## 2024-08-11 ENCOUNTER — Ambulatory Visit (HOSPITAL_COMMUNITY)

## 2024-08-11 ENCOUNTER — Telehealth (HOSPITAL_COMMUNITY): Payer: Self-pay | Admitting: Professional

## 2024-08-11 ENCOUNTER — Encounter (HOSPITAL_COMMUNITY): Payer: Self-pay

## 2024-08-11 NOTE — Telephone Encounter (Signed)
"  See call log  "

## 2024-08-12 ENCOUNTER — Ambulatory Visit (INDEPENDENT_AMBULATORY_CARE_PROVIDER_SITE_OTHER): Admitting: Professional

## 2024-08-12 ENCOUNTER — Ambulatory Visit (HOSPITAL_COMMUNITY)

## 2024-08-12 DIAGNOSIS — F332 Major depressive disorder, recurrent severe without psychotic features: Secondary | ICD-10-CM | POA: Diagnosis not present

## 2024-08-12 DIAGNOSIS — R4589 Other symptoms and signs involving emotional state: Secondary | ICD-10-CM

## 2024-08-12 NOTE — Psych (Signed)
 " [facility] BH PHP THERAPIST PROGRESS NOTE  Jacqueline Griffith [id number]   Session Time: 9:00 am - 10:00 am  Participation Level: Active  Behavioral Response: CasualAlertAnxious and Depressed  Type of Therapy: Group Therapy  Treatment Goals addressed: Coping  Progress Towards Goals: Progressing  Interventions: CBT, DBT, Solution Focused, Strength-based, Supportive, and Reframing  Therapist Response: Clinician led check-in regarding current stressors and situation, and review of patient completed daily inventory. Clinician utilized active listening and empathetic response and validated patient emotions. Clinician facilitated processing group on pertinent issues.?   Summary: Patient arrived within time allowed.?Patient rates their depression at a 7 and anxiety at a 5 on a scale of 1-10 with 10 being best. When asked about sleep and appetite, pt reports they slept about 7 hours last night and ate 1x yesterday. Pt denied experiencing SI/SH thoughts since last session. Pt reports she has had some thoughts of hopelessness/worthlessness related to some self sabotage and spiraling. She reports she saw her psychiatrist yesterday and discussed [place], saw her counselor, and spent time with a friend for the first time in years. She said she felt good about the time spent together and feels supported. Pt able to process.?Pt engaged in discussion.?     Session Time: 10:00 am - 11:00 am   Participation Level: Active   Behavioral Response: CasualAlertDepressed   Type of Therapy: Group Therapy   Treatment Goals addressed: Coping   Progress Towards Goals: Progressing   Interventions: CBT, DBT, Solution Focused, Strength-based, Supportive, and Reframing   Summary: Cln led processing group for pt's current struggles. Group members shared stressors and provided support and feedback. Cln brought in topics of boundaries, radical acceptance, finding joy in the small things, and focusing on what is  within our control to inform discussion.    Summary:  Pt able to process and provide support to group.     Session Time: 11:00 am - 12:00 pm   Participation Level: Active   Behavioral Response: CasualAlertAnxious   Type of Therapy: Group Therapy   Treatment Goals addressed: Coping   Progress Towards Goals: Progressing   Interventions: CBT, DBT, Solution Focused, Strength-based, Supportive, and Reframing   Therapist Response: Clinician introduced topic of Positive Psychology. Group watched Positive Psychology Ted-Talk. Patients discussed how their lens of life affects the way they feel.    Therapist Response:  Pt engaged in discussion. Pt reports they relates to moving the goal post of success and happiness.   Pt identifies doing a positive journal entry each day as a way to help change their lens.         Session Time: 12:00 pm - 12:50 pm   Participation Level: Active   Behavioral Response: CasualAlertAnxious   Type of Therapy: Group Therapy   Treatment Goals addressed: Coping   Progress Towards Goals: Progressing   Interventions: Occupational Therapy   Therapist Response: Cln E Hollan led occupational therapy group.    Summary: See note      Session Time: 12:50 pm - 1:00 pm   Participation Level: Active   Behavioral Response: CasualAlertAnxious   Type of Therapy: Group Therapy   Treatment Goals addressed: Coping   Progress Towards Goals: Progressing   Interventions: CBT, DBT, Solution Focused, Strength-based, Supportive, and Reframing   Therapist Response: 12:50 pm - 1:00 pm: Clinician led check-out. Clinician assessed for immediate needs, medication compliance and efficacy, and safety concerns?   Summary: 12:50 pm - 1:00 pm: At check-out, patient reports no immediate concerns. Patient demonstrates progress as  evidenced by engagement and responsiveness to treatment. Patient denies SI/HI/self-harm thoughts at the end of group.  Suicidal/Homicidal:  Nowithout intent/plan  Plan: ?Pt will continue in PHP and medication management while continuing to work on decreasing depression symptoms,?SI, and anxiety symptoms,?and increasing the ability to self manage symptoms.      Collaboration of Care: Medication Management AEB Staci Kerns, NP  Patient/Guardian was advised Release of Information must be obtained prior to any record release in order to collaborate their care with an outside provider. Patient/Guardian was advised if they have not already done so to contact the registration department to sign all necessary forms in order for us  to release information regarding their care.   Consent: Patient/Guardian gives verbal consent for treatment and assignment of benefits for services provided during this visit. Patient/Guardian expressed understanding and agreed to proceed.   Diagnosis: Major depressive disorder, recurrent, severe without psychotic features (HCC) [F33.2]    1. Major depressive disorder, recurrent, severe without psychotic features [facility])      [name], [facility] 08/12/2024  "

## 2024-08-13 ENCOUNTER — Encounter (HOSPITAL_COMMUNITY): Payer: Self-pay

## 2024-08-13 NOTE — Therapy (Signed)
 [facility] [address] [place], [place], [address] Phone: [phone]   Fax:  [phone]  Occupational Therapy Treatment Virtual Visit via Video Note  I connected with Jacqueline Griffith on 08/13/24 at 12:25 PM EST by a video enabled telemedicine application and verified that I am speaking with the correct person using two identifiers.  Location: Patient: home Provider: office   I discussed the limitations of evaluation and management by telemedicine and the availability of in person appointments. The patient expressed understanding and agreed to proceed.    The patient was advised to call back or seek an in-person evaluation if the symptoms worsen or if the condition fails to improve as anticipated.  I provided 50 minutes of non-face-to-face time during this encounter.   Patient Details  Name: Jacqueline Griffith MRN: [id number] Date of Birth: 1991/05/02 No data recorded  Encounter Date: 08/08/2024   OT End of Session - 08/13/24 1452     Visit Number 5    Number of Visits 15    Date for Recertification  08/12/24    OT Start Time 1200    OT Stop Time 1250    OT Time Calculation (min) 50 min    Activity Tolerance Patient tolerated treatment well          Past Medical History:  Diagnosis Date   Acne    hx accutane use    ADHD (attention deficit hyperactivity disorder)    Depression    Frostbite    toes   H/O: substance abuse (HCC)    History of polycystic ovarian syndrome    Thyromegaly     Past Surgical History:  Procedure Laterality Date   HIP SURGERY Right 2019    There were no vitals filed for this visit.   Subjective Assessment - 08/13/24 1451     Currently in Pain? No/denies    Pain Score 0-No pain             Group Session:  S: Feeling better today overall.   O: Patient participated in a structured Monday morning Goal Check-In group focused on reviewing previously identified SMART goals, with emphasis  on the relevance and personal meaning of each goal as a driver of motivation and follow-through. The group provided an opportunity for patients to reflect on goal progress, identify barriers encountered over the weekend, and clarify next steps for the upcoming week through guided discussion and peer sharing.   A: Patient demonstrated active engagement throughout the Goal Check-In group, contributing verbally to discussion, reflecting on personal goal progress, and responding appropriately to prompts. Patient displayed intact attention, goal-directed thought process, and emerging insight into barriers and facilitators impacting goal follow-through. Presentation indicates ability to self-monitor progress and apply group content toward upcoming goal planning.   P: Continue to attend PHP OT group sessions 5x week for 3 weeks to promote daily structure, social engagement, and opportunities to develop and utilize adaptive strategies to maximize functional performance in preparation for safe transition and integration back into school, work, and the community. Plan to address topic of tbd in next OT group session.                   OT Education - 08/13/24 1452     Education Details Monday Goal Check In    Person(s) Educated Patient    Methods Explanation;Handout    Comprehension Verbalized understanding           OT Short Term Goals - 08/07/24  1507       OT SHORT TERM GOAL #1   Title Client will independently identify and modify three areas of the current routine that contribute to increased stress or dysfunction by the end of therapy.    Time 3    Period Weeks    Status On-going    Target Date 08/12/24      OT SHORT TERM GOAL #2   Title Client will independently identify and list three personal triggers that lead to heightened stress or negative emotional responses by the end of 3 sessions.    Status On-going      OT SHORT TERM GOAL #3   Title Client will demonstrate the  use of at least two different relaxation techniques (e.g., deep breathing, progressive muscle relaxation) without prompting when faced with a stressful situation in four out of five trials.    Status On-going                   Plan - 08/13/24 1453     Psychosocial Skills Interpersonal Interaction;Habits;Environmental  Adaptations;Coping Strategies;Routines and Behaviors          Patient will benefit from skilled therapeutic intervention in order to improve the following deficits and impairments:       Psychosocial Skills: Interpersonal Interaction, Habits, Environmental  Adaptations, Coping Strategies, Routines and Behaviors   Visit Diagnosis: Difficulty coping    Problem List Patient Active Problem List   Diagnosis Date Noted   Major depressive disorder, recurrent, severe without psychotic features (HCC) 05/05/2024   Abnormal finding on CT scan 10/12/2012   Stomach problems 10/12/2012   Frequent bowel movements 10/12/2012   Nausea and vomiting in adult 08/26/2011   Sore throat 08/26/2011   ADJUSTMENT DISORDER WITH MIXED FEATURES 12/07/2009   SYNCOPE AND COLLAPSE 09/28/2009   MALAISE AND FATIGUE 08/08/2009   FOOT PAIN, BILATERAL 08/21/2008   THROAT PAIN 05/22/2008   TELOGEN EFFLUVIUM 08/20/2007   Abnormalities of hair 08/20/2007    [place] KANDICE Purpura, OT 08/13/2024, 2:53 PM  Dallas Purpura, OT   [facility] [address] [place], [place], [address] Phone: [phone]   Fax:  [phone]  Name: Jacqueline Griffith MRN: [id number] Date of Birth: Apr 19, 1991

## 2024-08-13 NOTE — Therapy (Signed)
 [facility] [address] [place], [place], [address] Phone: [phone]   Fax:  [phone]  Occupational Therapy Treatment  Patient Details  Name: Jacqueline Griffith MRN: [id number] Date of Birth: January 05, 1991 No data recorded  Encounter Date: 08/12/2024   OT End of Session - 08/13/24 1532     Visit Number 7    Number of Visits 15    Date for Recertification  08/12/24    OT Start Time 1200    OT Stop Time 1255    OT Time Calculation (min) 55 min    Activity Tolerance Patient tolerated treatment well    Behavior During Therapy [facility] for tasks assessed/performed          Past Medical History:  Diagnosis Date   Acne    hx accutane use    ADHD (attention deficit hyperactivity disorder)    Depression    Frostbite    toes   H/O: substance abuse (HCC)    History of polycystic ovarian syndrome    Thyromegaly     Past Surgical History:  Procedure Laterality Date   HIP SURGERY Right 2019    There were no vitals filed for this visit.   Subjective Assessment - 08/13/24 1531     Currently in Pain? No/denies    Pain Score 0-No pain              Group Session:  S: Feeling good. Optimistic.   O: Patient participated in occupational therapy group focused on grounding techniques to support emotional regulation, present-moment awareness, and functional coping. Group included brief education on the purpose of grounding, guided practice of select grounding strategies, and discussion of how these techniques can be applied during periods of anxiety, emotional overwhelm, or dissociation. Patient was attentive and engaged at a level appropriate for current presentation and participated in group discussion and/or experiential practice. Behavior and affect were appropriate to the group setting.   A: Patient demonstrates developing understanding of grounding as a tool to manage emotional dysregulation and improve functional participation. Patient  shows emerging ability to identify situations in which grounding techniques may be beneficial and to engage in guided practice when prompted. Continued occupational therapy intervention is indicated to reinforce independent use of grounding strategies, support generalization to daily routines, and promote effective coping outside of structured treatment settings.   P: Continue to attend PHP OT group sessions 5x week for 3 weeks to promote daily structure, social engagement, and opportunities to develop and utilize adaptive strategies to maximize functional performance in preparation for safe transition and integration back into school, work, and the community. Plan to address topic of Monday Goal Check In in next OT group session.                  OT Education - 08/13/24 1531     Education Details Grounding Techniques    Person(s) Educated Patient    Methods Explanation;Handout    Comprehension Verbalized understanding           OT Short Term Goals - 08/07/24 1507       OT SHORT TERM GOAL #1   Title Client will independently identify and modify three areas of the current routine that contribute to increased stress or dysfunction by the end of therapy.    Time 3    Period Weeks    Status On-going    Target Date 08/12/24      OT SHORT TERM GOAL #2   Title Client  will independently identify and list three personal triggers that lead to heightened stress or negative emotional responses by the end of 3 sessions.    Status On-going      OT SHORT TERM GOAL #3   Title Client will demonstrate the use of at least two different relaxation techniques (e.g., deep breathing, progressive muscle relaxation) without prompting when faced with a stressful situation in four out of five trials.    Status On-going                   Plan - 08/13/24 1532     Psychosocial Skills Interpersonal Interaction;Habits;Environmental  Adaptations;Coping Strategies;Routines and Behaviors           Patient will benefit from skilled therapeutic intervention in order to improve the following deficits and impairments:       Psychosocial Skills: Interpersonal Interaction, Habits, Environmental  Adaptations, Coping Strategies, Routines and Behaviors   Visit Diagnosis: Difficulty coping    Problem List Patient Active Problem List   Diagnosis Date Noted   Major depressive disorder, recurrent, severe without psychotic features (HCC) 05/05/2024   Abnormal finding on CT scan 10/12/2012   Stomach problems 10/12/2012   Frequent bowel movements 10/12/2012   Nausea and vomiting in adult 08/26/2011   Sore throat 08/26/2011   ADJUSTMENT DISORDER WITH MIXED FEATURES 12/07/2009   SYNCOPE AND COLLAPSE 09/28/2009   MALAISE AND FATIGUE 08/08/2009   FOOT PAIN, BILATERAL 08/21/2008   THROAT PAIN 05/22/2008   TELOGEN EFFLUVIUM 08/20/2007   Abnormalities of hair 08/20/2007    [place] KANDICE Purpura, OT 08/13/2024, 3:33 PM  D[place] Purpura, OT   [facility] [address] [place], [place], [address] Phone: [phone]   Fax:  [phone]  Name: Jacqueline Griffith MRN: [id number] Date of Birth: 24-Sep-1990

## 2024-08-13 NOTE — Therapy (Signed)
 [facility] [address] [place], [place], [address] Phone: [phone]   Fax:  [phone]  Occupational Therapy Treatment Virtual Visit via Video Note  I connected with Jacqueline Griffith on 08/13/24 at 12:25 PM EST by a video enabled telemedicine application and verified that I am speaking with the correct person using two identifiers.  Location: Patient: home Provider: office   I discussed the limitations of evaluation and management by telemedicine and the availability of in person appointments. The patient expressed understanding and agreed to proceed.    The patient was advised to call back or seek an in-person evaluation if the symptoms worsen or if the condition fails to improve as anticipated.  I provided 50 minutes of non-face-to-face time during this encounter.   Patient Details  Name: Jacqueline Griffith MRN: [id number] Date of Birth: 17-Jun-1991 No data recorded  Encounter Date: 08/09/2024   OT End of Session - 08/13/24 1506     Visit Number 6    Number of Visits 15    Date for Recertification  08/12/24    OT Start Time 1200    OT Stop Time 1250    OT Time Calculation (min) 50 min    Activity Tolerance Patient tolerated treatment well          Past Medical History:  Diagnosis Date   Acne    hx accutane use    ADHD (attention deficit hyperactivity disorder)    Depression    Frostbite    toes   H/O: substance abuse (HCC)    History of polycystic ovarian syndrome    Thyromegaly     Past Surgical History:  Procedure Laterality Date   HIP SURGERY Right 2019    There were no vitals filed for this visit.   Subjective Assessment - 08/13/24 1506     Currently in Pain? No/denies    Pain Score 0-No pain               Group Session:  S: Doing well. Continuing to work on my goals. Making good progress and focusing on staying consistent. That's my biggest thing, consistency.   O: Patient participated in the  Life After PHP occupational therapy group focused on preparing for the transition from structured treatment to independent daily living. Group content addressed changes in routine, increased personal responsibility, use of coping strategies outside of PHP, and identification of supports needed for continued stability. Patient was attentive and engaged at a level appropriate for current presentation. Patient contributed to discussion and/or small-group activity by identifying anticipated challenges after discharge and strategies to manage them. Affect and behavior were appropriate to the group setting. No behavioral concerns noted during group.   A: Patient demonstrates emerging insight into the structure and support differences between PHP and post-discharge life. Patient is able to identify at least one routine, coping strategy, or support that may assist with maintaining stability after discharge. Continued OT intervention remains indicated to reinforce routine development, role resumption, problem-solving, and application of coping strategies in less structured environments to support safe and successful transition from PHP.   P: Continue to attend PHP OT group sessions 5x week for 3 weeks to promote daily structure, social engagement, and opportunities to develop and utilize adaptive strategies to maximize functional performance in preparation for safe transition and integration back into school, work, and the community. Plan to address topic of tbd in next OT group session.  OT Education - 08/13/24 1506     Education Details Life After PHP    Person(s) Educated Patient    Methods Explanation;Handout    Comprehension Verbalized understanding           OT Short Term Goals - 08/07/24 1507       OT SHORT TERM GOAL #1   Title Client will independently identify and modify three areas of the current routine that contribute to increased stress or dysfunction by the  end of therapy.    Time 3    Period Weeks    Status On-going    Target Date 08/12/24      OT SHORT TERM GOAL #2   Title Client will independently identify and list three personal triggers that lead to heightened stress or negative emotional responses by the end of 3 sessions.    Status On-going      OT SHORT TERM GOAL #3   Title Client will demonstrate the use of at least two different relaxation techniques (e.g., deep breathing, progressive muscle relaxation) without prompting when faced with a stressful situation in four out of five trials.    Status On-going                   Plan - 08/13/24 1507     Occupational performance deficits (Please refer to evaluation for details): Social Participation;Work;Rest and Sleep;Leisure    Psychosocial Skills Interpersonal Interaction;Habits;Environmental  Adaptations;Coping Strategies;Routines and Behaviors          Patient will benefit from skilled therapeutic intervention in order to improve the following deficits and impairments:       Psychosocial Skills: Interpersonal Interaction, Habits, Environmental  Adaptations, Coping Strategies, Routines and Behaviors   Visit Diagnosis: Difficulty coping    Problem List Patient Active Problem List   Diagnosis Date Noted   Major depressive disorder, recurrent, severe without psychotic features (HCC) 05/05/2024   Abnormal finding on CT scan 10/12/2012   Stomach problems 10/12/2012   Frequent bowel movements 10/12/2012   Nausea and vomiting in adult 08/26/2011   Sore throat 08/26/2011   ADJUSTMENT DISORDER WITH MIXED FEATURES 12/07/2009   SYNCOPE AND COLLAPSE 09/28/2009   MALAISE AND FATIGUE 08/08/2009   FOOT PAIN, BILATERAL 08/21/2008   THROAT PAIN 05/22/2008   TELOGEN EFFLUVIUM 08/20/2007   Abnormalities of hair 08/20/2007    Jacqueline Griffith, OT 08/13/2024, 3:07 PM  Jacqueline Griffith, OT    [facility] [address] [place],  [place], [address] Phone: [phone]   Fax:  [phone]  Name: Jacqueline Griffith MRN: [id number] Date of Birth: [birth year]

## 2024-08-14 ENCOUNTER — Telehealth (HOSPITAL_COMMUNITY): Payer: Self-pay | Admitting: Licensed Clinical Social Worker

## 2024-08-14 NOTE — Telephone Encounter (Signed)
 Cln called to inform pt that group will be virtual on Monday, 2/2 due to heavy snowfall. Left vm detailing same, as well as that virtual group will be from 9 am to 1 pm and will be via [employer]. Cln also advised that group on Tuesday, 2/3 is planned to be in-person and from 10 am to 2 pm.

## 2024-08-15 ENCOUNTER — Ambulatory Visit (HOSPITAL_COMMUNITY)

## 2024-08-15 ENCOUNTER — Ambulatory Visit (HOSPITAL_COMMUNITY): Admitting: Licensed Clinical Social Worker

## 2024-08-15 DIAGNOSIS — F332 Major depressive disorder, recurrent severe without psychotic features: Secondary | ICD-10-CM

## 2024-08-15 NOTE — Progress Notes (Signed)
 Virtual Visit via Video Note  I connected with Jacqueline Griffith on 08/15/24 at  9:00 AM EST by a video enabled telemedicine application and verified that I am speaking with the correct person using two identifiers.  Location: Patient: Home Provider: Home Office    I discussed the limitations of evaluation and management by telemedicine and the availability of in person appointments. The patient expressed understanding and agreed to proceed.    I discussed the assessment and treatment plan with the patient. The patient was provided an opportunity to ask questions and all were answered. The patient agreed with the plan and demonstrated an understanding of the instructions.   The patient was advised to call back or seek an in-person evaluation if the symptoms worsen or if the condition fails to improve as anticipated.  I provided 15 minutes of non-face-to-face time during this encounter.   Staci LOISE Kerns, NP   [facility] Discharge Summary  Jacqueline Griffith [id number]  Admission date: 07/25/2024 Discharge date: 08/15/2024  Reason for admission: per admission assessment note:Jacqueline Griffith 34 year old female who presents after attending residential treatment facility [facility] in [place] .  Patient was seen and evaluted face to face by this provider. [name] reported experiencing intense spiraling.   She reports a current diagnosis related to major depressive disorder, borderline personality disorder and posttraumatic stress disorder.  Reports experiencing suicidal ideations denying plan or intent.  States she is currently followed by a Rupinder for medication management.  Reports she is currently prescribed Caplyta, Auvelity , propranolol and receiving Spravato treatments.   Progress in Program Toward Treatment Goals: Progressing patient attended and participated with daily group session with active and engaged participation.   Denying any suicidal or homicidal ideations.  Denied auditory visual hallucinations.  She reports she has been taking and tolerating medications well.  Denied medication refills at discharge.  Patient to keep outpatient follow-up appointments with current medication provider and individual therapist.  Progress (rationale): Take all of you medications as prescribed by your mental healthcare provider.  Report any adverse effects and reactions from your medications to your outpatient provider promptly.  Do not engage in alcohol and or illegal drug use while on prescription medicines. Keep all scheduled appointments. This is to ensure that you are getting refills on time and to avoid any interruption in your medication.  If you are unable to keep an appointment call to reschedule.  Be sure to follow up with resources and follow ups given. In the event of worsening symptoms call the crisis hotline, 911, and or go to the nearest emergency department for appropriate evaluation and treatment of symptoms. Follow-up with your primary care provider for your medical issues, concerns and or health care needs.    Collaboration of Care: Psychiatrist AEB Dr Vincente and Dawn Averso for therapy services and Other Continued medication as directed   Patient/Guardian was advised Release of Information must be obtained prior to any record release in order to collaborate their care with an outside provider. Patient/Guardian was advised if they have not already done so to contact the registration department to sign all necessary forms in order for us  to release information regarding their care.   Consent: Patient/Guardian gives verbal consent for treatment and assignment of benefits for services provided during this visit. Patient/Guardian expressed understanding and agreed to proceed.   Staci Kerns NP  08/15/2024

## 2024-08-16 ENCOUNTER — Ambulatory Visit (HOSPITAL_COMMUNITY)

## 2024-08-17 ENCOUNTER — Ambulatory Visit (HOSPITAL_COMMUNITY)

## 2024-08-17 ENCOUNTER — Telehealth (HOSPITAL_COMMUNITY): Payer: Self-pay | Admitting: Psychiatry

## 2024-08-17 NOTE — Telephone Encounter (Signed)
 D:  Pt returned the MH-IOP Case Manager's call.  Pt is requesting to start on 08-23-24 instead of 08-19-24 d/t assisting her sister-in-law with the kids.  A:  Oriented pt.  Pt will start virtual MH-IOP on 08-23-24.  Inform the treatment team.  R:  Pt receptive.

## 2024-08-17 NOTE — Telephone Encounter (Signed)
 D:  Pt will be stepping down from PHP to virtual MH-IOP on 08-19-24.  A:  Placed call to orient pt, but there was no answer.  Left vm for pt to call the case manager if she has any questions/concerns.

## 2024-08-18 ENCOUNTER — Ambulatory Visit (HOSPITAL_COMMUNITY)

## 2024-08-19 ENCOUNTER — Ambulatory Visit (HOSPITAL_COMMUNITY)

## 2024-08-23 ENCOUNTER — Other Ambulatory Visit (HOSPITAL_COMMUNITY)

## 2024-08-24 ENCOUNTER — Other Ambulatory Visit (HOSPITAL_COMMUNITY)

## 2024-08-25 ENCOUNTER — Other Ambulatory Visit (HOSPITAL_COMMUNITY)

## 2024-08-26 ENCOUNTER — Other Ambulatory Visit (HOSPITAL_COMMUNITY)

## 2024-08-29 ENCOUNTER — Other Ambulatory Visit (HOSPITAL_COMMUNITY)

## 2024-08-30 ENCOUNTER — Other Ambulatory Visit (HOSPITAL_COMMUNITY)

## 2024-08-31 ENCOUNTER — Other Ambulatory Visit (HOSPITAL_COMMUNITY)

## 2024-09-01 ENCOUNTER — Other Ambulatory Visit (HOSPITAL_COMMUNITY)

## 2024-09-02 ENCOUNTER — Other Ambulatory Visit (HOSPITAL_COMMUNITY)

## 2024-09-05 ENCOUNTER — Other Ambulatory Visit (HOSPITAL_COMMUNITY)

## 2024-09-06 ENCOUNTER — Other Ambulatory Visit (HOSPITAL_COMMUNITY)

## 2024-09-07 ENCOUNTER — Other Ambulatory Visit (HOSPITAL_COMMUNITY)

## 2024-09-08 ENCOUNTER — Other Ambulatory Visit (HOSPITAL_COMMUNITY)

## 2024-09-09 ENCOUNTER — Other Ambulatory Visit (HOSPITAL_COMMUNITY)

## 2024-09-12 ENCOUNTER — Other Ambulatory Visit (HOSPITAL_COMMUNITY)

## 2024-09-13 ENCOUNTER — Other Ambulatory Visit (HOSPITAL_COMMUNITY)

## 2024-09-14 ENCOUNTER — Other Ambulatory Visit (HOSPITAL_COMMUNITY)

## 2024-09-15 ENCOUNTER — Other Ambulatory Visit (HOSPITAL_COMMUNITY)

## 2024-09-16 ENCOUNTER — Other Ambulatory Visit (HOSPITAL_COMMUNITY)

## 2024-09-19 ENCOUNTER — Other Ambulatory Visit (HOSPITAL_COMMUNITY)

## 2024-09-20 ENCOUNTER — Other Ambulatory Visit (HOSPITAL_COMMUNITY)
